# Patient Record
Sex: Male | Born: 1975 | Race: Black or African American | Hispanic: No | Marital: Single | Smoking: Current every day smoker
Health system: Southern US, Community
[De-identification: ages and names within clinical notes are randomized; demographics above are authoritative.]

## PROBLEM LIST (undated history)

## (undated) ENCOUNTER — Ambulatory Visit: Discharge status: Absent without leave | Payer: Self-pay

## (undated) ENCOUNTER — Ambulatory Visit: Payer: Self-pay | Source: Home / Self Care

---

## 1999-10-25 ENCOUNTER — Emergency Department (HOSPITAL_COMMUNITY): Admission: EM | Admit: 1999-10-25 | Discharge: 1999-10-25 | Payer: Self-pay | Admitting: Emergency Medicine

## 2001-06-08 ENCOUNTER — Emergency Department (HOSPITAL_COMMUNITY): Admission: EM | Admit: 2001-06-08 | Discharge: 2001-06-09 | Payer: Self-pay | Admitting: Emergency Medicine

## 2001-06-09 ENCOUNTER — Encounter: Payer: Self-pay | Admitting: Emergency Medicine

## 2017-04-02 ENCOUNTER — Emergency Department (HOSPITAL_COMMUNITY)
Admission: EM | Admit: 2017-04-02 | Discharge: 2017-04-02 | Disposition: A | Discharge status: Home / Self Care | Payer: Self-pay | Attending: Emergency Medicine | Admitting: Emergency Medicine

## 2017-04-02 ENCOUNTER — Encounter (HOSPITAL_COMMUNITY): Payer: Self-pay | Admitting: *Deleted

## 2017-04-02 ENCOUNTER — Other Ambulatory Visit: Payer: Self-pay

## 2017-04-02 DIAGNOSIS — H00015 Hordeolum externum left lower eyelid: Secondary | ICD-10-CM | POA: Insufficient documentation

## 2017-04-02 DIAGNOSIS — H5712 Ocular pain, left eye: Secondary | ICD-10-CM | POA: Insufficient documentation

## 2017-04-02 DIAGNOSIS — F17210 Nicotine dependence, cigarettes, uncomplicated: Secondary | ICD-10-CM | POA: Insufficient documentation

## 2017-04-02 NOTE — ED Provider Notes (Signed)
[facility] EMERGENCY DEPARTMENT Provider Note   CSN: [id number] Arrival date & time: 04/02/17  0406     History   Chief Complaint Chief Complaint  Patient presents with  . Eye Problem    HPI Ronald Valencia is a 41 y.o. male presents to ED for evaluation of localized, intermittent, achy pain and swelling to left lower eyelid x 2 days. Denies trauma, warmth, drainage, blurred vision, double vision, fevers, pain with eye movements. No interventions PTA. No sick contacts. Aggravated by direct palpation.   HPI  History reviewed. No pertinent past medical history.  There are no active problems to display for this patient.   History reviewed. No pertinent surgical history.     Home Medications    Prior to Admission medications   Not on File    Family History No family history on file.  Social History Social History   Tobacco Use  . Smoking status: Current Every Day Smoker  . Smokeless tobacco: Never Used  Substance Use Topics  . Alcohol use: Yes  . Drug use: No     Allergies   Patient has no known allergies.   Review of Systems Review of Systems  Eyes: Positive for pain.       +eye lid swelling   All other systems reviewed and are negative.    Physical Exam Updated Vital Signs BP (!) 142/91 (BP Location: Left Arm)   Pulse 92   Temp 98.2 F (36.8 C) (Oral)   Resp 18   Ht 6\' 1"  (1.854 m)   Wt 88.5 kg (195 lb)   SpO2 98%   BMI 25.73 kg/m   Physical Exam  Constitutional: He is oriented to person, place, and time. He appears well-developed and well-nourished. No distress.  NAD.  HENT:  Head: Normocephalic and atraumatic.  Right Ear: External ear normal.  Left Ear: External ear normal.  Nose: Nose normal.  Mildly tender, localize bump to medial aspect of left lower eyelid with localized edema only. Mildly inject lower eyelid conjunctiva. Sclera normal. No drainage. PERRL and EOMs intact bilaterally. Surrounding lower eyelid and  upper eyelid normal w/o cellulitis, edema, warmth. No pain with eye movements.    Eyes: Conjunctivae and EOM are normal. No scleral icterus.  Neck: Normal range of motion.  Cardiovascular: Normal rate.  Pulmonary/Chest: Effort normal.  Musculoskeletal: Normal range of motion. He exhibits no deformity.  Neurological: He is alert and oriented to person, place, and time.  Skin: Skin is warm and dry. Capillary refill takes less than 2 seconds.  Psychiatric: He has a normal mood and affect. His behavior is normal. Judgment and thought content normal.  Nursing note and vitals reviewed.    ED Treatments / Results  Labs (all labs ordered are listed, but only abnormal results are displayed) Labs Reviewed - No data to display  EKG  EKG Interpretation None       Radiology No results found.  Procedures Procedures (including critical care time)  Medications Ordered in ED Medications - No data to display   Initial Impression / Assessment and Plan / ED Course  I have reviewed the triage vital signs and the nursing notes.  Pertinent labs & imaging results that were available during my care of the patient were reviewed by me and considered in my medical decision making (see chart for details).     History and exam consistend with uncomplicated hordeolum. No evidence of peri/orbital cellulitis or conjunctival/scleral involvement. No pain with EOMs. No  fevers. Will tx conservatively with warm compresses and ophtho f/u if does not resolve within 1-2 weeks. Discussed s/s that would warrant return to ED for peri/orbital cellulitis, abscess, conjunctivitis. Pt and wife at bedside agreeable with ED tx and d/c plan.   Final Clinical Impressions(s) / ED Diagnoses   Final diagnoses:  Hordeolum externum of left lower eyelid    ED Discharge Orders    None       Liberty HandyGibbons, Claudia J, PA-C 04/02/17 [id number]    Zadie RhineWickline, Donald, MD 04/02/17 [phone]

## 2017-04-02 NOTE — Discharge Instructions (Signed)
A hordeolum (Stye) is an abscess or collection of infection in eyelid. It can cause swelling, pain, redness. These arise from glands in the eyelash follicle. Most styes resolve spontaneously over several days and do not require specific intervention.   Apply warm compresses for about 15 min four times per day in order to facilitate drainage. Massage and gently push upward on the eyelid to aid in drainage. Follow up with an eye doctor if the size does not improve in one to two weeks. An eye doctor can sometimes help with drainage.   Return to ED if there is extensive redness, warmth, pain to the skin around the eye/cheek.

## 2017-04-02 NOTE — ED Triage Notes (Signed)
Pt c/o L eye swelling x 2 days, denies allergies or blurred vision.

## 2017-09-04 ENCOUNTER — Ambulatory Visit (HOSPITAL_COMMUNITY)
Admission: EM | Admit: 2017-09-04 | Discharge: 2017-09-04 | Disposition: A | Discharge status: Home / Self Care | Payer: Self-pay | Attending: Family Medicine | Admitting: Family Medicine

## 2017-09-04 ENCOUNTER — Other Ambulatory Visit: Payer: Self-pay

## 2017-09-04 ENCOUNTER — Encounter (HOSPITAL_COMMUNITY): Payer: Self-pay | Admitting: Emergency Medicine

## 2017-09-04 DIAGNOSIS — Z113 Encounter for screening for infections with a predominantly sexual mode of transmission: Secondary | ICD-10-CM

## 2017-09-04 DIAGNOSIS — Z202 Contact with and (suspected) exposure to infections with a predominantly sexual mode of transmission: Secondary | ICD-10-CM | POA: Insufficient documentation

## 2017-09-04 DIAGNOSIS — F17210 Nicotine dependence, cigarettes, uncomplicated: Secondary | ICD-10-CM | POA: Insufficient documentation

## 2017-09-04 MED ORDER — AZITHROMYCIN 250 MG PO TABS
1000.0000 mg | ORAL_TABLET | Freq: Once | ORAL | Status: AC
  Administered 2017-09-04: 1000 mg via ORAL

## 2017-09-04 MED ORDER — AZITHROMYCIN 250 MG PO TABS
ORAL_TABLET | ORAL | Status: AC
  Filled 2017-09-04: qty 4

## 2017-09-04 MED ORDER — CEFTRIAXONE SODIUM 250 MG IJ SOLR
250.0000 mg | Freq: Once | INTRAMUSCULAR | Status: AC
  Administered 2017-09-04: 250 mg via INTRAMUSCULAR

## 2017-09-04 MED ORDER — CEFTRIAXONE SODIUM 250 MG IJ SOLR
INTRAMUSCULAR | Status: AC
  Filled 2017-09-04: qty 250

## 2017-09-04 NOTE — ED Provider Notes (Signed)
[facility]   [id number] 09/04/17 Arrival Time: 1009   SUBJECTIVE:  Holden Maniscalco is a 42 y.o. male who presents requesting STI screening.  Last unprotected sexual encounter was 2 days ago.  Sexually active with 2 male partners.  Patient reports exposure to partner with BV and trichomonas.  Currently asymptomatic.  Requests treatment.  Denies fever, chills, nausea, vomiting, abdominal or penile discharge, penile rashes or lesions, or urinary symptoms.    No LMP for male patient.  ROS: As per HPI.  History reviewed. No pertinent past medical history. History reviewed. No pertinent surgical history. No Known Allergies No current facility-administered medications on file prior to encounter.    No current outpatient medications on file prior to encounter.    Social History   Socioeconomic History  . Marital status: Married    Spouse name: Not on file  . Number of children: Not on file  . Years of education: Not on file  . Highest education level: Not on file  Occupational History  . Not on file  Social Needs  . Financial resource strain: Not on file  . Food insecurity:    Worry: Not on file    Inability: Not on file  . Transportation needs:    Medical: Not on file    Non-medical: Not on file  Tobacco Use  . Smoking status: Current Every Day Smoker    Packs/day: 0.25    Types: Cigarettes  . Smokeless tobacco: Never Used  Substance and Sexual Activity  . Alcohol use: Yes  . Drug use: No  . Sexual activity: Yes    Birth control/protection: None  Lifestyle  . Physical activity:    Days per week: Not on file    Minutes per session: Not on file  . Stress: Not on file  Relationships  . Social connections:    Talks on phone: Not on file    Gets together: Not on file    Attends religious service: Not on file    Active member of club or organization: Not on file    Attends meetings of clubs or organizations: Not on file    Relationship status: Not on file  .  Intimate partner violence:    Fear of current or ex partner: Not on file    Emotionally abused: Not on file    Physically abused: Not on file    Forced sexual activity: Not on file  Other Topics Concern  . Not on file  Social History Narrative  . Not on file   Family History  Family history unknown: Yes    OBJECTIVE:  Vitals:   09/04/17 1034  BP: (!) 142/87  Pulse: 81  Temp: 97.9 F (36.6 C)  TempSrc: Oral  SpO2: 100%     General appearance: alert, cooperative, appears stated age and no distress Throat: lips, mucosa, and tongue normal; teeth and gums normal Lungs: CTA bilaterally without adventitious breath sounds Heart: regular rate and rhythm.  Radial pulses 2+ symmetrical bilaterally Abdomen: soft, non-tender; bowel sounds normal; nontender to palpation; no guarding Skin: warm and dry Psychological:  Alert and cooperative. Normal mood and affect.  No results found for this or any previous visit.  Labs Reviewed  HIV ANTIBODY (ROUTINE TESTING)  [facility]  URINE CYTOLOGY ANCILLARY ONLY    ASSESSMENT & PLAN:  1. STD exposure     Meds ordered this encounter  Medications  . azithromycin (ZITHROMAX) tablet 1,000 mg  . cefTRIAXone (ROCEPHIN) injection 250 mg  Pending: Labs Reviewed  HIV ANTIBODY (ROUTINE TESTING)  [facility]  URINE CYTOLOGY ANCILLARY ONLY    Given rocephin  injection and azithromycin 1g in office Urine cytology obtained HIV/ syphilis ordered We will follow up with you regarding the results of your tests If tests are positive, please abstain from sexual activity for at least 7 days and notify partners Follow up with PCP if symptoms persists Return here or go to ER if you have any new or worsening symptoms    Reviewed expectations re: course of current medical issues. Questions answered. Outlined signs and symptoms indicating need for more acute intervention. Patient verbalized understanding. After Visit Summary given.       Rennis Harding, PA-C 09/04/17 1102

## 2017-09-04 NOTE — ED Triage Notes (Signed)
Pt reports being exposed to BV and Trichomonas.  Pt denies any symptoms.

## 2017-09-04 NOTE — Discharge Instructions (Addendum)
Given rocephin  injection and azithromycin 1g in office Urine cytology obtained HIV/ syphilis ordered We will follow up with you regarding the results of your tests If tests are positive, please abstain from sexual activity for at least 7 days and notify partners Follow up with PCP if symptoms persists Return here or go to ER if you have any new or worsening symptoms

## 2017-09-05 LAB — HIV ANTIBODY (ROUTINE TESTING W REFLEX): HIV Screen 4th Generation wRfx: NONREACTIVE

## 2017-09-05 LAB — RPR: RPR Ser Ql: NONREACTIVE

## 2017-09-05 LAB — URINE CYTOLOGY ANCILLARY ONLY
Chlamydia: NEGATIVE
Neisseria Gonorrhea: NEGATIVE
Trichomonas: NEGATIVE

## 2017-09-06 LAB — URINE CYTOLOGY ANCILLARY ONLY
BACTERIAL VAGINITIS: POSITIVE — AB
Candida vaginitis: NEGATIVE

## 2017-09-10 ENCOUNTER — Telehealth (HOSPITAL_COMMUNITY): Payer: Self-pay

## 2017-09-10 MED ORDER — METRONIDAZOLE 500 MG PO TABS: 500.0000 mg | ORAL_TABLET | Freq: Two times a day (BID) | ORAL | 0 refills | Status: DC

## 2017-09-10 NOTE — Telephone Encounter (Signed)
Bacterial vaginosis is positive. This was not treated at the urgent care visit.  Flagyl 500 mg BID x 7 days #14 no refills sent to patients pharmacy of choice per [place]. Attempted to reach patient. No answer. Message sent to MyChart, no voicemail set up.

## 2018-06-09 ENCOUNTER — Encounter (HOSPITAL_COMMUNITY): Payer: Self-pay

## 2018-06-09 ENCOUNTER — Emergency Department (HOSPITAL_COMMUNITY)
Admission: EM | Admit: 2018-06-09 | Discharge: 2018-06-10 | Disposition: A | Discharge status: Home / Self Care | Payer: Self-pay | Attending: Emergency Medicine | Admitting: Emergency Medicine

## 2018-06-09 ENCOUNTER — Emergency Department (HOSPITAL_COMMUNITY): Payer: Self-pay

## 2018-06-09 DIAGNOSIS — Y929 Unspecified place or not applicable: Secondary | ICD-10-CM | POA: Insufficient documentation

## 2018-06-09 DIAGNOSIS — S20212A Contusion of left front wall of thorax, initial encounter: Secondary | ICD-10-CM | POA: Insufficient documentation

## 2018-06-09 DIAGNOSIS — Z79899 Other long term (current) drug therapy: Secondary | ICD-10-CM | POA: Insufficient documentation

## 2018-06-09 DIAGNOSIS — W2209XA Striking against other stationary object, initial encounter: Secondary | ICD-10-CM | POA: Insufficient documentation

## 2018-06-09 DIAGNOSIS — Y939 Activity, unspecified: Secondary | ICD-10-CM | POA: Insufficient documentation

## 2018-06-09 DIAGNOSIS — Y999 Unspecified external cause status: Secondary | ICD-10-CM | POA: Insufficient documentation

## 2018-06-09 DIAGNOSIS — R10819 Abdominal tenderness, unspecified site: Secondary | ICD-10-CM | POA: Insufficient documentation

## 2018-06-09 DIAGNOSIS — F17210 Nicotine dependence, cigarettes, uncomplicated: Secondary | ICD-10-CM | POA: Insufficient documentation

## 2018-06-09 NOTE — ED Triage Notes (Signed)
Pt states that he was at work today and had a mechanical fall onto his L ribs, now having pain, worse with movement and taking a deep breath.

## 2018-06-10 ENCOUNTER — Emergency Department (HOSPITAL_COMMUNITY): Payer: Self-pay

## 2018-06-10 LAB — CBC WITH DIFFERENTIAL/PLATELET
Abs Immature Granulocytes: 0.02 10*3/uL (ref 0.00–0.07)
Basophils Absolute: 0 10*3/uL (ref 0.0–0.1)
Basophils Relative: 0 %
Eosinophils Absolute: 0.1 10*3/uL (ref 0.0–0.5)
Eosinophils Relative: 1 %
HCT: 45.9 % (ref 39.0–52.0)
Hemoglobin: 14.6 g/dL (ref 13.0–17.0)
Immature Granulocytes: 0 %
Lymphocytes Relative: 37 %
Lymphs Abs: 3.1 10*3/uL (ref 0.7–4.0)
MCH: 29.2 pg (ref 26.0–34.0)
MCHC: 31.8 g/dL (ref 30.0–36.0)
MCV: 91.8 fL (ref 80.0–100.0)
Monocytes Absolute: 0.7 10*3/uL (ref 0.1–1.0)
Monocytes Relative: 8 %
Neutro Abs: 4.5 10*3/uL (ref 1.7–7.7)
Neutrophils Relative %: 54 %
Platelets: 272 10*3/uL (ref 150–400)
RBC: 5 MIL/uL (ref 4.22–5.81)
RDW: 12.9 % (ref 11.5–15.5)
WBC: 8.4 10*3/uL (ref 4.0–10.5)
nRBC: 0 % (ref 0.0–0.2)

## 2018-06-10 LAB — BASIC METABOLIC PANEL
Anion gap: 10 (ref 5–15)
BUN: 13 mg/dL (ref 6–20)
CO2: 23 mmol/L (ref 22–32)
Calcium: 9.3 mg/dL (ref 8.9–10.3)
Chloride: 104 mmol/L (ref 98–111)
Creatinine, Ser: 0.99 mg/dL (ref 0.61–1.24)
GFR calc Af Amer: >60 mL/min (ref >60)
GFR calc non Af Amer: >60 mL/min (ref >60)
Glucose, Bld: 103 mg/dL — ABNORMAL HIGH (ref 70–99)
Potassium: 3.6 mmol/L (ref 3.5–5.1)
Sodium: 137 mmol/L (ref 135–145)

## 2018-06-10 MED ORDER — MORPHINE SULFATE (PF) 4 MG/ML IV SOLN
4.0000 mg | Freq: Once | INTRAVENOUS | Status: AC
  Administered 2018-06-10: 4 mg via INTRAVENOUS
  Filled 2018-06-10: qty 1

## 2018-06-10 MED ORDER — HYDROCODONE-ACETAMINOPHEN 5-325 MG PO TABS: 1.0000 | ORAL_TABLET | Freq: Four times a day (QID) | ORAL | 0 refills | Status: DC | PRN

## 2018-06-10 MED ORDER — IBUPROFEN 600 MG PO TABS: 600.0000 mg | ORAL_TABLET | Freq: Four times a day (QID) | ORAL | 0 refills | Status: DC | PRN

## 2018-06-10 MED ORDER — METHOCARBAMOL 750 MG PO TABS: 750.0000 mg | ORAL_TABLET | Freq: Two times a day (BID) | ORAL | 0 refills | Status: DC

## 2018-06-10 MED ORDER — IOHEXOL 300 MG/ML  SOLN
100.0000 mL | Freq: Once | INTRAMUSCULAR | Status: AC | PRN
  Administered 2018-06-10: 100 mL via INTRAVENOUS

## 2018-06-10 NOTE — ED Provider Notes (Signed)
[facility] EMERGENCY DEPARTMENT Provider Note   CSN: [id number] Arrival date & time: 06/09/18  2331    History   Chief Complaint Chief Complaint  Patient presents with  . Rib pain    HPI Ronald Valencia is a 43 y.o. male who is previously healthy who presents with left rib and left flank pain after falling on a guard rail while carrying tools.  He reports he has had significant pain to the area since.  Is worse with talking, breathing, moving.  He denies any chest pain, nausea, vomiting.  He did not take any medication prior to arrival.     HPI  History reviewed. No pertinent past medical history.  There are no active problems to display for this patient.   History reviewed. No pertinent surgical history.      Home Medications    Prior to Admission medications   Medication Sig Start Date End Date Taking? Authorizing Provider  HYDROcodone-acetaminophen (NORCO/VICODIN) 5-325 MG tablet Take 1 tablet by mouth every 6 (six) hours as needed for severe pain. 06/10/18   Jaquelyne Firkus, Waylan Boga, PA-C  ibuprofen (ADVIL,MOTRIN) 600 MG tablet Take 1 tablet (600 mg total) by mouth every 6 (six) hours as needed. 06/10/18   Akil Hoos, Waylan Boga, PA-C  methocarbamol (ROBAXIN) 750 MG tablet Take 1 tablet (750 mg total) by mouth 2 (two) times daily. 06/10/18   Hazley Dezeeuw, Waylan Boga, PA-C  metroNIDAZOLE (FLAGYL) 500 MG tablet Take 1 tablet (500 mg total) by mouth 2 (two) times daily. 09/10/17   Rennis Harding, PA-C    Family History Family History  Family history unknown: Yes    Social History Social History   Tobacco Use  . Smoking status: Current Every Day Smoker    Packs/day: 0.25    Types: Cigarettes  . Smokeless tobacco: Never Used  Substance Use Topics  . Alcohol use: Yes  . Drug use: No     Allergies   Patient has no known allergies.   Review of Systems Review of Systems  Constitutional: Negative for chills and fever.  HENT: Negative for facial swelling and sore throat.    Respiratory: Positive for shortness of breath (pain with deep breaths due to rib pain).   Cardiovascular: Negative for chest pain.  Gastrointestinal: Negative for abdominal pain, nausea and vomiting.  Genitourinary: Negative for dysuria.  Musculoskeletal: Negative for back pain.  Skin: Negative for rash and wound.  Neurological: Negative for headaches.  Psychiatric/Behavioral: The patient is not nervous/anxious.      Physical Exam Updated Vital Signs BP (!) 142/99 (BP Location: Left Arm)   Pulse 84   Temp 98.1 F (36.7 C) (Oral)   Resp 16   SpO2 100%   Physical Exam Vitals signs and nursing note reviewed.  Constitutional:      General: He is not in acute distress.    Appearance: He is well-developed. He is not diaphoretic.  HENT:     Head: Normocephalic and atraumatic.     Mouth/Throat:     Pharynx: No oropharyngeal exudate.  Eyes:     General: No scleral icterus.       Right eye: No discharge.        Left eye: No discharge.     Conjunctiva/sclera: Conjunctivae normal.     Pupils: Pupils are equal, round, and reactive to light.  Neck:     Musculoskeletal: Normal range of motion and neck supple.     Thyroid: No thyromegaly.  Cardiovascular:  Rate and Rhythm: Normal rate and regular rhythm.     Heart sounds: Normal heart sounds. No murmur. No friction rub. No gallop.   Pulmonary:     Effort: Pulmonary effort is normal. No respiratory distress.     Breath sounds: Normal breath sounds. No stridor. No wheezing or rales.  Abdominal:     General: Bowel sounds are normal. There is no distension.     Palpations: Abdomen is soft.     Tenderness: There is abdominal tenderness. There is no guarding or rebound.       Comments: Significant tenderness to the lower ribs, but also significant in the left lateral abdomen not with underlying ribs, no ecchymosis noted  Lymphadenopathy:     Cervical: No cervical adenopathy.  Skin:    General: Skin is warm and dry.      Coloration: Skin is not pale.     Findings: No rash.  Neurological:     Mental Status: He is alert.     Coordination: Coordination normal.      ED Treatments / Results  Labs (all labs ordered are listed, but only abnormal results are displayed) Labs Reviewed  BASIC METABOLIC PANEL - Abnormal; Notable for the following components:      Result Value   Glucose, Bld 103 (*)    All other components within normal limits  CBC WITH DIFFERENTIAL/PLATELET    EKG None  Radiology Dg Ribs Unilateral W/chest Left  Result Date: 06/10/2018 CLINICAL DATA:  Mechanical fall with pain EXAM: LEFT RIBS AND CHEST - 3+ VIEW COMPARISON:  Report 06/09/2001 FINDINGS: No fracture or other bone lesions are seen involving the ribs. There is no evidence of pneumothorax or pleural effusion. Both lungs are clear. Heart size and mediastinal contours are within normal limits. IMPRESSION: Negative. Electronically Signed   By: Jasmine Pang M.D.   On: 06/10/2018 00:24   Ct Abdomen Pelvis W Contrast  Result Date: 06/10/2018 CLINICAL DATA:  Left abdominal and rib tenderness after blunt trauma earlier today, fall onto left side. EXAM: CT ABDOMEN AND PELVIS WITH CONTRAST TECHNIQUE: Multidetector CT imaging of the abdomen and pelvis was performed using the standard protocol following bolus administration of intravenous contrast. CONTRAST:  OMNIPAQUE IOHEXOL 300 MG/ML  SOLN COMPARISON:  Left rib radiographs earlier this day. FINDINGS: Lower chest: No basilar pneumothorax. No fracture of included lower ribs. No pleural fluid. Hepatobiliary: No hepatic injury or perihepatic hematoma. Gallbladder is unremarkable. Pancreas: No evidence of injury. No ductal dilatation or inflammation. Spleen: No splenic injury or perisplenic hematoma. Adrenals/Urinary Tract: No adrenal hemorrhage or renal injury identified. Homogeneous renal enhancement. No hydronephrosis. Bladder is unremarkable. Stomach/Bowel: No evidence of bowel or mesenteric  injury. No bowel wall thickening or inflammatory change. Normal appendix. Stomach is unremarkable. Vascular/Lymphatic: No vascular injury. Abdominal aorta and IVC are intact. No retroperitoneal fluid. Minor aortic atherosclerosis. No adenopathy. Reproductive: Prostate is unremarkable. Other: No confluent body wall contusion. No free fluid or free air. Musculoskeletal: No fracture of the included ribs, thoracolumbar spine, or bony pelvis. IMPRESSION: No evidence of acute traumatic injury to the abdomen or pelvis. Electronically Signed   By: Narda Rutherford M.D.   On: 06/10/2018 02:13    Procedures Procedures (including critical care time)  Medications Ordered in ED Medications  morphine 4 MG/ML injection 4 mg (4 mg Intravenous Given 06/10/18 0111)  iohexol (OMNIPAQUE) 300 MG/ML solution 100 mL (100 mLs Intravenous Contrast Given 06/10/18 0139)     Initial Impression / Assessment and Plan /  ED Course  I have reviewed the triage vital signs and the nursing notes.  Pertinent labs & imaging results that were available during my care of the patient were reviewed by me and considered in my medical decision making (see chart for details).        Patient presenting with rib and abdominal contusions.  Labs are unremarkable.  Initial rib films negative.  However, considering patient significant tenderness to the left flank and abdomen, CT abdomen pelvis was conducted which showed no internal hemorrhage or rib fractures.  Patient given incentive spirometer to take home.  Will discharge home with ibuprofen, Vicodin, Robaxin.  I reviewed the [place] narcotic database and found no discrepancies.  Ice discussed.  Patient counseled on normal duration of symptoms with rib contusions.  Return precautions discussed.  Patient understands and agrees with plan.  Patient vital stable throughout ED course and discharged in satisfactory condition.  Final Clinical Impressions(s) / ED Diagnoses   Final diagnoses:  Contusion  of rib on left side, initial encounter    ED Discharge Orders         Ordered    ibuprofen (ADVIL,MOTRIN) 600 MG tablet  Every 6 hours PRN     06/10/18 0227    HYDROcodone-acetaminophen (NORCO/VICODIN) 5-325 MG tablet  Every 6 hours PRN     06/10/18 0227    methocarbamol (ROBAXIN) 750 MG tablet  2 times daily     06/10/18 0227           Emi Holes, PA-C 06/10/18 0258    Melene Plan, DO 06/10/18 [phone]

## 2018-06-10 NOTE — Discharge Instructions (Addendum)
Rib contusions can take several weeks to get better.Use ice 3-4 times daily alternating 20 minutes on, 20 minutes off.  Take ibuprofen every 6 hours as needed for your pain.  For breakthrough pain, you can take 1 Vicodin every 6 hours.  Take Robaxin twice daily as needed for muscle pain or spasms.  Do not drive or operate machinery while taking Robaxin or Vicodin.  Use incentive spirometer several times daily to help encourage deep breaths and prevent pneumonia.  Please return to the emergency department if you develop any new or worsening symptoms.   Do not drink alcohol, drive, operate machinery or participate in any other potentially dangerous activities while taking opiate pain medication as it may make you sleepy. Do not take this medication with any other sedating medications, either prescription or over-the-counter. If you were prescribed Percocet or Vicodin, do not take these with acetaminophen (Tylenol) as it is already contained within these medications and overdose of Tylenol is dangerous.   This medication is an opiate (or narcotic) pain medication and can be habit forming.  Use it as little as possible to achieve adequate pain control.  Do not use or use it with extreme caution if you have a history of opiate abuse or dependence. This medication is intended for your use only - do not give any to anyone else and keep it in a secure place where nobody else, especially children, have access to it. It will also cause or worsen constipation, so you may want to consider taking an over-the-counter stool softener while you are taking this medication.

## 2018-08-13 ENCOUNTER — Other Ambulatory Visit: Payer: Self-pay

## 2018-08-13 ENCOUNTER — Ambulatory Visit (HOSPITAL_COMMUNITY)
Admission: EM | Admit: 2018-08-13 | Discharge: 2018-08-13 | Disposition: A | Discharge status: Home / Self Care | Payer: Self-pay | Attending: Family Medicine | Admitting: Family Medicine

## 2018-08-13 ENCOUNTER — Encounter (HOSPITAL_COMMUNITY): Payer: Self-pay | Admitting: Emergency Medicine

## 2018-08-13 DIAGNOSIS — Z113 Encounter for screening for infections with a predominantly sexual mode of transmission: Secondary | ICD-10-CM

## 2018-08-13 DIAGNOSIS — Z202 Contact with and (suspected) exposure to infections with a predominantly sexual mode of transmission: Secondary | ICD-10-CM | POA: Insufficient documentation

## 2018-08-13 MED ORDER — CEFTRIAXONE SODIUM 250 MG IJ SOLR
INTRAMUSCULAR | Status: AC
  Filled 2018-08-13: qty 250

## 2018-08-13 MED ORDER — AZITHROMYCIN 250 MG PO TABS
1000.0000 mg | ORAL_TABLET | Freq: Once | ORAL | Status: AC
  Administered 2018-08-13: 10:00:00 1000 mg via ORAL

## 2018-08-13 MED ORDER — AZITHROMYCIN 250 MG PO TABS
ORAL_TABLET | ORAL | Status: AC
  Filled 2018-08-13: qty 4

## 2018-08-13 MED ORDER — CEFTRIAXONE SODIUM 250 MG IJ SOLR
250.0000 mg | Freq: Once | INTRAMUSCULAR | Status: AC
  Administered 2018-08-13: 10:00:00 250 mg via INTRAMUSCULAR

## 2018-08-13 NOTE — ED Provider Notes (Signed)
[facility]    CSN: [id number] Arrival date & time: 08/13/18  [id number]     History   Chief Complaint Chief Complaint  Patient presents with  . SEXUALLY TRANSMITTED DISEASE    HPI Ronald Valencia is a 43 y.o. male.   HPI 43 year old gentleman who is here today to be evaluated for possible sexually transmitted disease.  He states he is in a monogamous relationship.  He states that his girlfriend want a baby, they have not used any sexual protection for a year.  He states he received a phone call from his girlfriend that she had tested positive for a "bacterial infection".  She blames him and insisted that he be checked.  He states he has no symptoms, no rash, no dysuria, no penile discharge, no fever.  History reviewed. No pertinent past medical history.  There are no active problems to display for this patient.   History reviewed. No pertinent surgical history.     Home Medications    Prior to Admission medications   Not on File    Family History Family History  Family history unknown: Yes    Social History Social History   Tobacco Use  . Smoking status: Current Every Day Smoker    Packs/day: 0.25    Types: Cigarettes  . Smokeless tobacco: Never Used  Substance Use Topics  . Alcohol use: Yes  . Drug use: No     Allergies   Patient has no known allergies.   Review of Systems Review of Systems  Constitutional: Negative for chills and fever.  HENT: Negative for ear pain and sore throat.   Eyes: Negative for pain and visual disturbance.  Respiratory: Negative for cough and shortness of breath.   Cardiovascular: Negative for chest pain and palpitations.  Gastrointestinal: Negative for abdominal pain and vomiting.  Genitourinary: Negative for discharge, dysuria and hematuria.  Musculoskeletal: Negative for arthralgias and back pain.  Skin: Negative for color change and rash.  Neurological: Negative for seizures and syncope.  All other systems  reviewed and are negative.    Physical Exam Triage Vital Signs ED Triage Vitals  Enc Vitals Group     BP 08/13/18 0841 (!) 154/94     Pulse Rate 08/13/18 0841 87     Resp 08/13/18 0841 16     Temp 08/13/18 0841 97.9 F (36.6 C)     Temp Source 08/13/18 0841 Oral     SpO2 08/13/18 0841 99 %   No data found.  Updated Vital Signs BP (!) 154/94 (BP Location: Left Arm)   Pulse 87   Temp 97.9 F (36.6 C) (Oral)   Resp 16   SpO2 99%      Physical Exam Constitutional:      General: He is not in acute distress.    Appearance: He is well-developed.  HENT:     Head: Normocephalic and atraumatic.  Eyes:     Conjunctiva/sclera: Conjunctivae normal.     Pupils: Pupils are equal, round, and reactive to light.  Neck:     Musculoskeletal: Normal range of motion.  Cardiovascular:     Rate and Rhythm: Normal rate.  Pulmonary:     Effort: Pulmonary effort is normal. No respiratory distress.  Abdominal:     General: There is no distension.     Palpations: Abdomen is soft.  Genitourinary:    Comments: GU exam deferred Musculoskeletal: Normal range of motion.  Skin:    General: Skin is warm and dry.  Neurological:     Mental Status: He is alert.      UC Treatments / Results  Labs (all labs ordered are listed, but only abnormal results are displayed) Labs Reviewed  RPR  HIV ANTIBODY (ROUTINE TESTING W REFLEX)  URINE CYTOLOGY ANCILLARY ONLY    EKG None  Radiology No results found.  Procedures Procedures (including critical care time)  Medications Ordered in UC Medications  cefTRIAXone (ROCEPHIN) injection 250 mg (has no administration in time range)  azithromycin (ZITHROMAX) tablet 1,000 mg (has no administration in time range)    Initial Impression / Assessment and Plan / UC Course  I have reviewed the triage vital signs and the nursing notes.  Pertinent labs & imaging results that were available during my care of the patient were reviewed by me and  considered in my medical decision making (see chart for details).     Reviewed importance of safe sex.  Discussed potential STD exposures.  Discussed BV. Final Clinical Impressions(s) / UC Diagnoses   Final diagnoses:  Possible exposure to STD     Discharge Instructions     Because you have possibly been exposed to a sexually transmitted disease you were being tested for chlamydia, gonorrhea, trichomonas, syphilis and HIV. You have been treated with antibiotics to cover chlamydia and gonorrhea We did lab testing during this visit.  If there are any abnormal findings or positive cultures , you will be notified.  If all of your tests are normal, you will not be called.   If you need your culture results, they will be available in 3 to 5 days.  You may call the office or check My Chart    ED Prescriptions    None     Controlled Substance Prescriptions [place] Controlled Substance Registry consulted? Not Applicable   Eustace Moore, MD 08/13/18 [phone]

## 2018-08-13 NOTE — ED Triage Notes (Signed)
Patient has been told by a partner that he was exposed to std.  Patient is denying pain or penile discharge

## 2018-08-13 NOTE — Discharge Instructions (Addendum)
Because you have possibly been exposed to a sexually transmitted disease you were being tested for chlamydia, gonorrhea, trichomonas, syphilis and HIV. You have been treated with antibiotics to cover chlamydia and gonorrhea We did lab testing during this visit.  If there are any abnormal findings or positive cultures , you will be notified.  If all of your tests are normal, you will not be called.   If you need your culture results, they will be available in 3 to 5 days.  You may call the office or check My Chart

## 2018-08-14 LAB — URINE CYTOLOGY ANCILLARY ONLY
Chlamydia: NEGATIVE
Neisseria Gonorrhea: NEGATIVE
Trichomonas: NEGATIVE

## 2018-08-14 LAB — RPR: RPR Ser Ql: NONREACTIVE

## 2018-08-14 LAB — HIV ANTIBODY (ROUTINE TESTING W REFLEX): HIV Screen 4th Generation wRfx: NONREACTIVE

## 2018-11-01 ENCOUNTER — Other Ambulatory Visit: Payer: Self-pay

## 2018-11-01 ENCOUNTER — Encounter (HOSPITAL_COMMUNITY): Payer: Self-pay | Admitting: Emergency Medicine

## 2018-11-01 ENCOUNTER — Emergency Department (HOSPITAL_COMMUNITY)
Admission: EM | Admit: 2018-11-01 | Discharge: 2018-11-01 | Disposition: A | Discharge status: Home / Self Care | Payer: Self-pay | Attending: Emergency Medicine | Admitting: Emergency Medicine

## 2018-11-01 ENCOUNTER — Emergency Department (HOSPITAL_COMMUNITY): Payer: Self-pay

## 2018-11-01 DIAGNOSIS — Y999 Unspecified external cause status: Secondary | ICD-10-CM | POA: Insufficient documentation

## 2018-11-01 DIAGNOSIS — M62838 Other muscle spasm: Secondary | ICD-10-CM

## 2018-11-01 DIAGNOSIS — F17210 Nicotine dependence, cigarettes, uncomplicated: Secondary | ICD-10-CM | POA: Insufficient documentation

## 2018-11-01 DIAGNOSIS — Y93H9 Activity, other involving exterior property and land maintenance, building and construction: Secondary | ICD-10-CM | POA: Insufficient documentation

## 2018-11-01 DIAGNOSIS — S29012A Strain of muscle and tendon of back wall of thorax, initial encounter: Secondary | ICD-10-CM | POA: Insufficient documentation

## 2018-11-01 DIAGNOSIS — M62830 Muscle spasm of back: Secondary | ICD-10-CM | POA: Insufficient documentation

## 2018-11-01 DIAGNOSIS — Y9269 Other specified industrial and construction area as the place of occurrence of the external cause: Secondary | ICD-10-CM | POA: Insufficient documentation

## 2018-11-01 DIAGNOSIS — X509XXA Other and unspecified overexertion or strenuous movements or postures, initial encounter: Secondary | ICD-10-CM | POA: Insufficient documentation

## 2018-11-01 LAB — BASIC METABOLIC PANEL
Anion gap: 8 (ref 5–15)
BUN: 13 mg/dL (ref 6–20)
CO2: 24 mmol/L (ref 22–32)
Calcium: 9 mg/dL (ref 8.9–10.3)
Chloride: 107 mmol/L (ref 98–111)
Creatinine, Ser: 1.02 mg/dL (ref 0.61–1.24)
GFR calc Af Amer: >60 mL/min (ref >60)
GFR calc non Af Amer: >60 mL/min (ref >60)
Glucose, Bld: 101 mg/dL — ABNORMAL HIGH (ref 70–99)
Potassium: 4.3 mmol/L (ref 3.5–5.1)
Sodium: 139 mmol/L (ref 135–145)

## 2018-11-01 LAB — CBC
HCT: 45.5 % (ref 39.0–52.0)
Hemoglobin: 15 g/dL (ref 13.0–17.0)
MCH: 30.5 pg (ref 26.0–34.0)
MCHC: 33 g/dL (ref 30.0–36.0)
MCV: 92.7 fL (ref 80.0–100.0)
Platelets: 269 10*3/uL (ref 150–400)
RBC: 4.91 MIL/uL (ref 4.22–5.81)
RDW: 12.6 % (ref 11.5–15.5)
WBC: 9.1 10*3/uL (ref 4.0–10.5)
nRBC: 0 % (ref 0.0–0.2)

## 2018-11-01 LAB — TROPONIN I (HIGH SENSITIVITY)
Troponin I (High Sensitivity): 3 ng/L (ref <18)
Troponin I (High Sensitivity): 3 ng/L

## 2018-11-01 MED ORDER — CYCLOBENZAPRINE HCL 10 MG PO TABS: 5.0000 mg | ORAL_TABLET | Freq: Every day | ORAL | 0 refills | Status: AC

## 2018-11-01 MED ORDER — KETOROLAC TROMETHAMINE 30 MG/ML IJ SOLN
30.0000 mg | Freq: Once | INTRAMUSCULAR | Status: AC
  Administered 2018-11-01: 07:00:00 30 mg via INTRAVENOUS
  Filled 2018-11-01: qty 1

## 2018-11-01 MED ORDER — SODIUM CHLORIDE 0.9% FLUSH: 3.0000 mL | Freq: Once | INTRAVENOUS | Status: DC

## 2018-11-01 NOTE — Discharge Instructions (Signed)
You may use over-the-counter Motrin (Ibuprofen), Acetaminophen (Tylenol), topical muscle creams such as SalonPas, Icy Hot, Bengay, etc. Please stretch, apply heat, and have massage therapy for additional assistance. ° °

## 2018-11-01 NOTE — ED Provider Notes (Signed)
[facility] Provider Note   CSN: [id number] Arrival date & time: 11/01/18  0015    History   Chief Complaint Chief Complaint  Patient presents with  . Chest Pain    HPI Ronald Valencia is a 43 y.o. male with no significant PMH who presents to the emergency room with a 2 day history of chest and back pain. Pt describes the pain as sharp and shooting and radiates all the way down his back and to the center of his chest. He states it is 10/10 at rest. The pain is worsened by initial movements and made minimally better after moving for awhile. Denies shortness of breath, nausea, or vomiting. Of note, pt works two labor intensive jobs.    Chest Pain Pain location:  Substernal area Pain quality: sharp and shooting   Pain radiates to:  Upper back and mid back Pain severity:  Severe Onset quality:  Gradual Duration:  2 days Timing:  Constant Progression:  Worsening Chronicity:  New Context: lifting   Relieved by: ibuprofen. Worsened by:  Exertion and movement Associated symptoms: back pain   Associated symptoms: no abdominal pain, no diaphoresis, no fever, no headache, no nausea, no palpitations, no shortness of breath, no vomiting and no weakness   Risk factors: hypertension and smoking    History reviewed. No pertinent past medical history.  There are no active problems to display for this patient.   History reviewed. No pertinent surgical history.    Home Medications    Prior to Admission medications   Medication Sig Start Date End Date Taking? Authorizing Provider  cyclobenzaprine (FLEXERIL) 10 MG tablet Take 0.5-1 tablets (5-10 mg total) by mouth at bedtime for 10 days. 11/01/18 11/11/18  Fatima Blank, MD    Family History Family History  Family history unknown: Yes    Social History Social History   Tobacco Use  . Smoking status: Current Every Day Smoker    Packs/day: 0.25    Types: Cigarettes  . Smokeless tobacco:  Never Used  Substance Use Topics  . Alcohol use: Yes  . Drug use: No     Allergies   Patient has no known allergies.   Review of Systems Review of Systems  Constitutional: Negative for chills, diaphoresis and fever.  Respiratory: Negative for chest tightness and shortness of breath.   Cardiovascular: Positive for chest pain. Negative for palpitations.  Gastrointestinal: Negative for abdominal pain, nausea and vomiting.  Musculoskeletal: Positive for back pain.  Neurological: Negative for weakness and headaches.    Physical Exam Updated Vital Signs BP (!) 134/91 (BP Location: Left Arm)   Pulse 68   Temp 98.7 F (37.1 C) (Oral)   Resp 17   Ht 6\' 1"  (1.854 m)   Wt 83.9 kg   SpO2 100%   BMI 24.41 kg/m   Physical Exam Vitals signs and nursing note reviewed.  Constitutional:      Appearance: He is well-developed.  HENT:     Head: Normocephalic and atraumatic.  Eyes:     Conjunctiva/sclera: Conjunctivae normal.  Neck:     Musculoskeletal: Normal range of motion. No muscular tenderness.  Cardiovascular:     Rate and Rhythm: Normal rate and regular rhythm.     Heart sounds: No murmur.  Pulmonary:     Effort: Pulmonary effort is normal. No respiratory distress.     Breath sounds: Normal breath sounds.  Abdominal:     Palpations: Abdomen is soft.  Tenderness: There is no abdominal tenderness.  Musculoskeletal:     Comments: Thoracic spine tenderness reproducible on palpation.  Skin:    General: Skin is warm and dry.  Neurological:     Mental Status: He is alert.     ED Treatments / Results  Labs (all labs ordered are listed, but only abnormal results are displayed) Labs Reviewed  BASIC METABOLIC PANEL - Abnormal; Notable for the following components:      Result Value   Glucose, Bld 101 (*)    All other components within normal limits  CBC  TROPONIN I (HIGH SENSITIVITY)  TROPONIN I (HIGH SENSITIVITY)    EKG EKG Interpretation  Date/Time:  Sunday  November 01 2018 00:25:06 EDT Ventricular Rate:  82 PR Interval:  170 QRS Duration: 86 QT Interval:  362 QTC Calculation: 422 R Axis:   92 Text Interpretation:  Normal sinus rhythm Right atrial enlargement Rightward axis Pulmonary disease pattern Abnormal ECG NO STEMI. No old tracing to compare Confirmed by Cardama, Pedro ([id number]) on 11/01/2018 6:12:29 AM   Radiology Dg Chest 2 View  Result Date: 11/01/2018 CLINICAL DATA:  43 year old male with chest pain. EXAM: CHEST - 2 VIEW COMPARISON:  Chest radiograph dated 06/09/2018 FINDINGS: The heart size and mediastinal contours are within normal limits. Both lungs are clear. The visualized skeletal structures are unremarkable. IMPRESSION: No active cardiopulmonary disease. Electronically Signed   By: Arash  Radparvar M.D.   On: 11/01/2018 01:13    Procedures Procedures (including critical care time)  Medications Ordered in ED Medications  sodium chloride flush (NS) 0.9 % injection 3 mL (has no administration in time range)  ketorolac (TORADOL) 30 MG/ML injection 30 mg (30 mg Intravenous Given 11/01/18 0701)    Initial Impression / Assessment and Plan / ED Course  I have reviewed the triage vital signs and the nursing notes.  Pertinent labs & imaging results that were available during my care of the patient were reviewed by me and considered in my medical decision making (see chart for details).   Ronald Valencia is a 43 y.o. male with no significant PMH who presents to the emergency room with a 2 day history of chest and back pain. ACS ruled out by troponin negative x2, EKG negative for ischemia, CXR negative for cardiopulmonary disease. Aortic dissection less likely with history of gradual onset and 2 day history of chest/back pain. PE less likely without tachycardia, shortness of breath, or hypoxia.  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 Back pain being reproducible on palpation also  make musculoskeletal most likely. Gave 30mg IM Toradol while in the emergency room. Pt is stable for discharge home this morning and counseled about continuing anti-inflammatories, heat, and stretching at home. Ordered for cyclobenzaprine to take for 10 days.     Final Clinical Impressions(s) / ED Diagnoses   Final diagnoses:  Muscle strain of upper back  Spasm of muscle    ED Discharge Orders         Ordered    cyclobenzaprine (FLEXERIL) 10 MG tablet  Daily at bedtime     07 /26/20 0707           Thom ChimesJones, Landrey Mahurin, MD 11/01/18 [id number]    Nira Connardama, Pedro Eduardo, MD 11/01/18 1948

## 2018-11-01 NOTE — ED Triage Notes (Signed)
Pt reports he has left sided chest/side/back pain since yesterday.  The pain worsens w/ movement.  Denies any other symptoms at this time.  No abnormal SOB, (pt has a very physical job).

## 2020-02-13 IMAGING — CT CT ABD-PELV W/ CM
2 of 5 series · 16 of 46 positions shown, 18 images · IV contrast (Omni 300)
Comparison: Left rib radiographs earlier this day.

CLINICAL DATA: Left abdominal and rib tenderness after blunt trauma
earlier today, fall onto left side.

EXAM:
CT ABDOMEN AND PELVIS WITH CONTRAST
TECHNIQUE: Multidetector CT imaging of the abdomen and pelvis was performed
using the standard protocol following bolus administration of
intravenous contrast.
CONTRAST:  100mL OMNIPAQUE IOHEXOL 300 MG/ML  SOLN

[Series 3: a/p w/ 5mm · axial · 0.77mm/px · z∈[+710,+1130]mm · 13 of 94 slices shown, 15 images]
[im 5/94  soft-tissue]
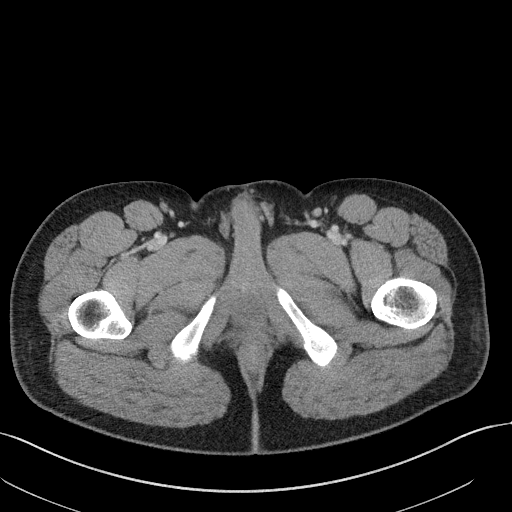
[im 5/94  bone]
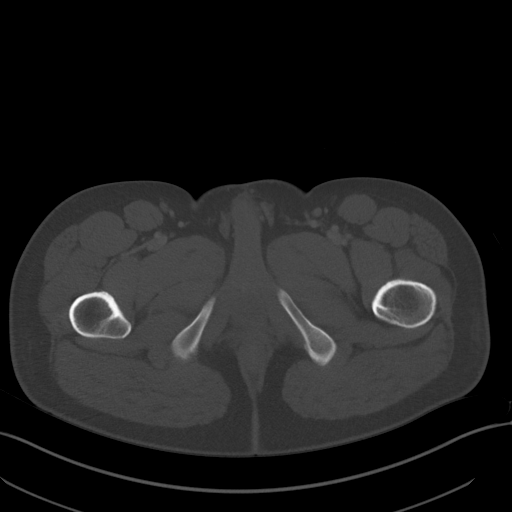
[im 14/94  soft-tissue]
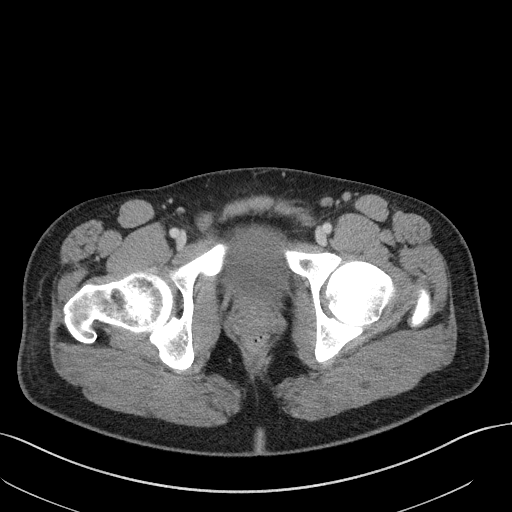
[im 19/94  soft-tissue]
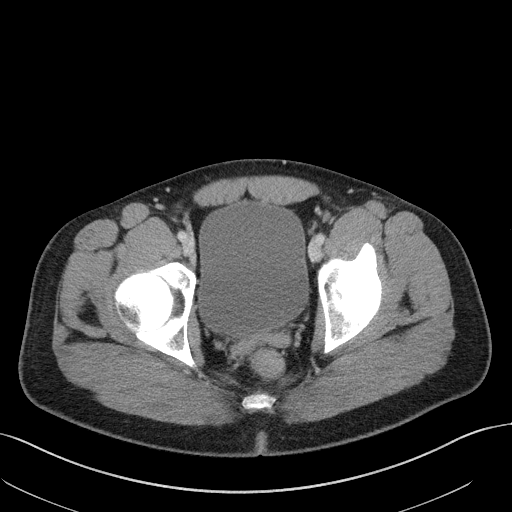
[im 28/94  soft-tissue]
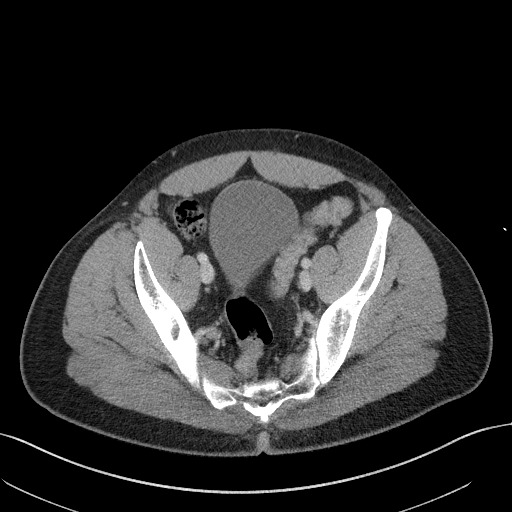
[im 33/94  soft-tissue]
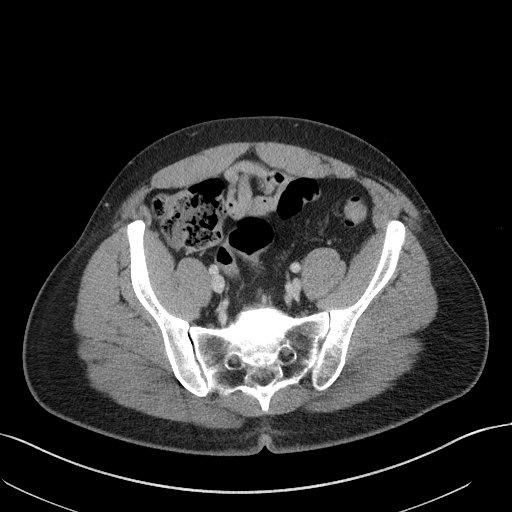
[im 42/94  soft-tissue]
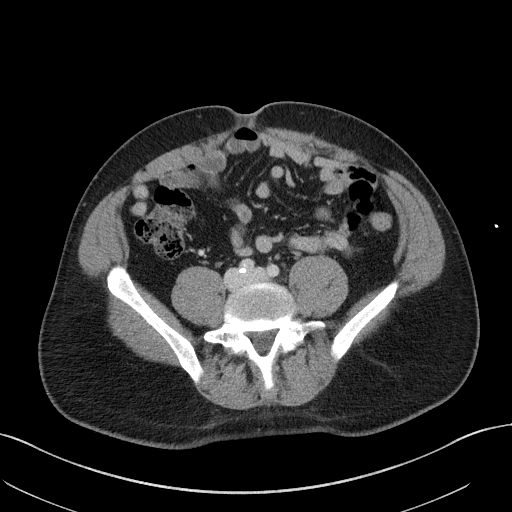
[im 47/94  soft-tissue]
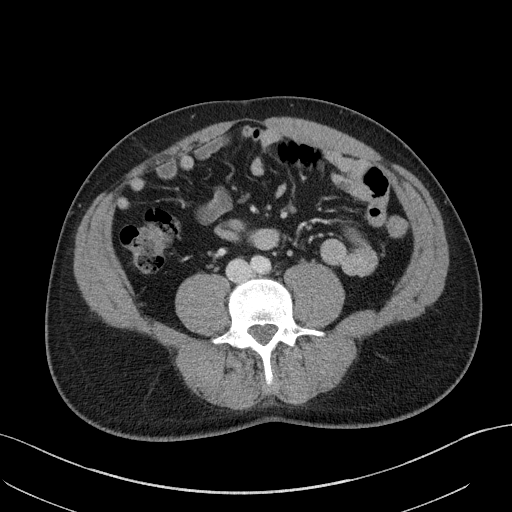
[im 52/94  soft-tissue]
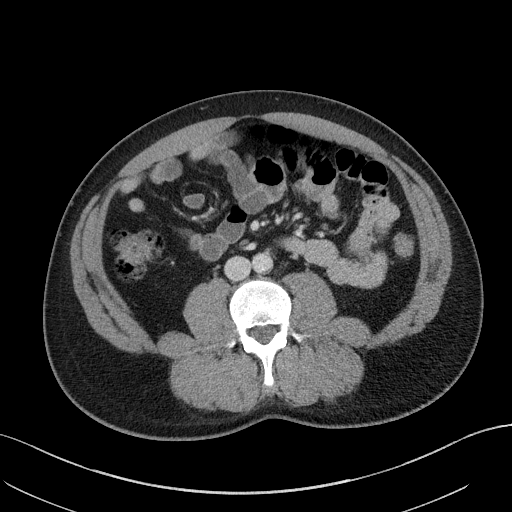
[im 61/94  soft-tissue]
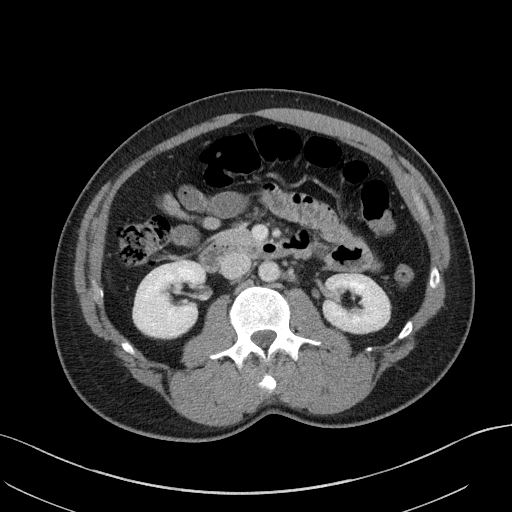
[im 61/94  bone]
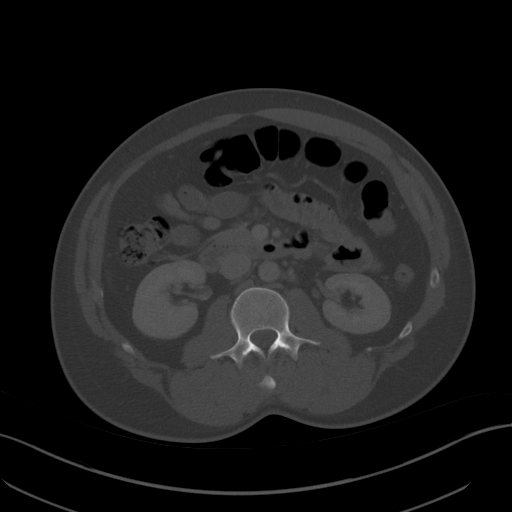
[im 66/94  soft-tissue]
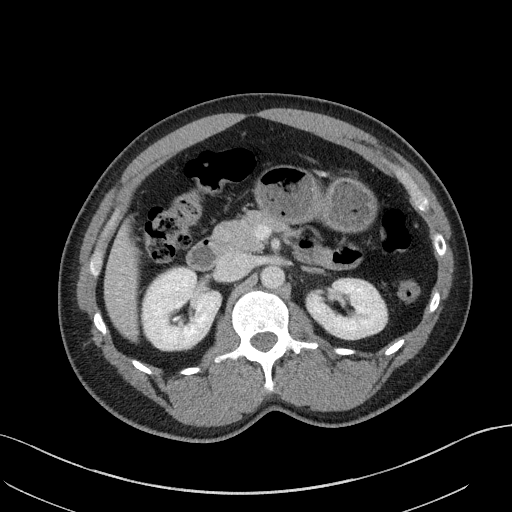
[im 75/94  soft-tissue]
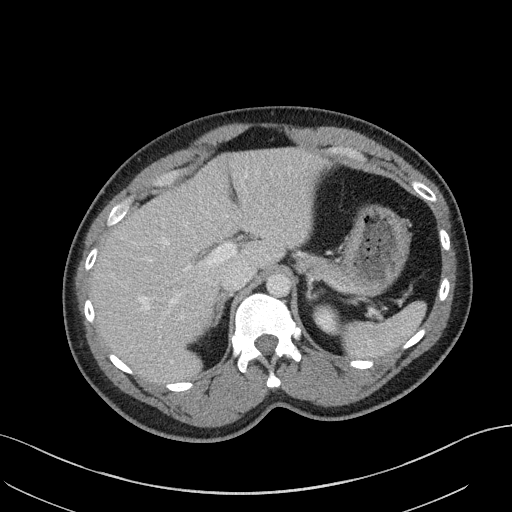
[im 80/94  soft-tissue]
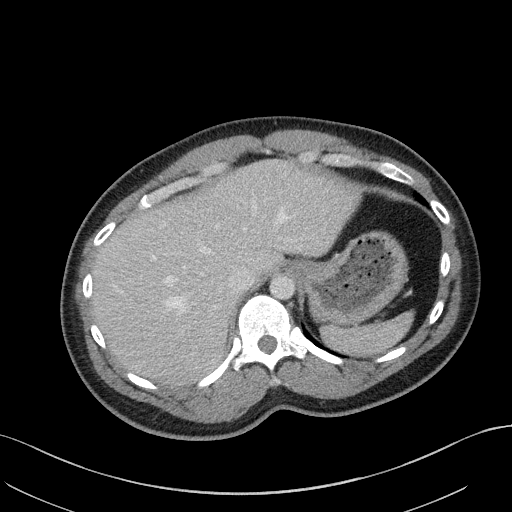
[im 89/94  soft-tissue]
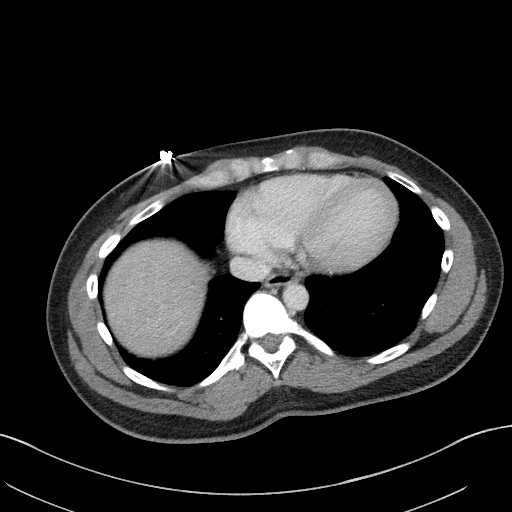

[Series 6: a/p w/ cor · coronal · 0.78mm/px · 3 of 140 slices shown]
[im 47/140  soft-tissue]
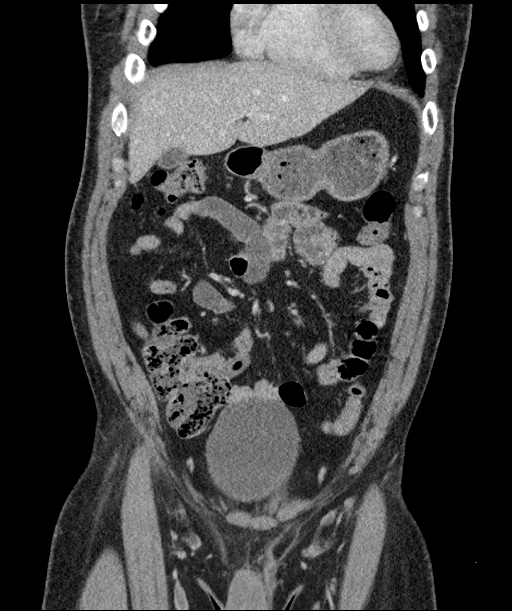
[im 62/140  soft-tissue]
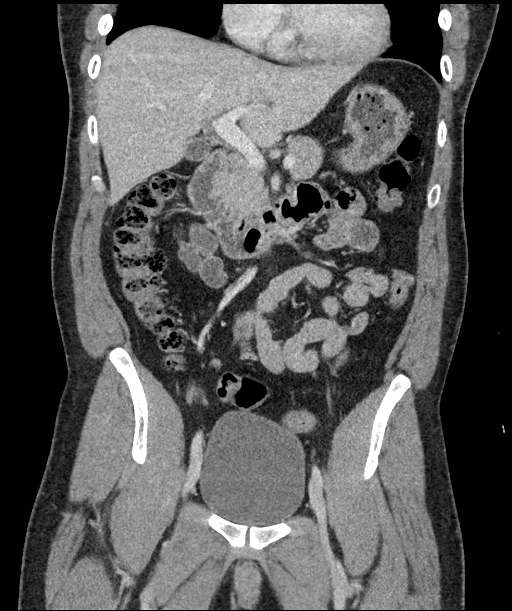
[im 78/140  soft-tissue]
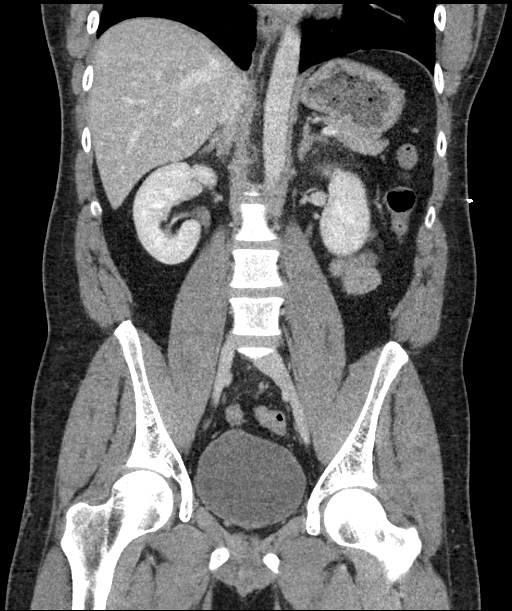

[16 of 46 positions shown; findings below may reference images not displayed]

FINDINGS: Lower chest: No basilar pneumothorax. No fracture of included lower
ribs. No pleural fluid.

Hepatobiliary: No hepatic injury or perihepatic hematoma.
Gallbladder is unremarkable.

Pancreas: No evidence of injury. No ductal dilatation or
inflammation.

Spleen: No splenic injury or perisplenic hematoma.

Adrenals/Urinary Tract: No adrenal hemorrhage or renal injury
identified. Homogeneous renal enhancement. No hydronephrosis.
Bladder is unremarkable.

Stomach/Bowel: No evidence of bowel or mesenteric injury. No bowel
wall thickening or inflammatory change. Normal appendix. Stomach is
unremarkable.

Vascular/Lymphatic: No vascular injury. Abdominal aorta and IVC are
intact. No retroperitoneal fluid. Minor aortic atherosclerosis. No
adenopathy.

Reproductive: Prostate is unremarkable.

Other: No confluent body wall contusion. No free fluid or free air.

Musculoskeletal: No fracture of the included ribs, thoracolumbar
spine, or bony pelvis.
IMPRESSION: No evidence of acute traumatic injury to the abdomen or pelvis.

## 2020-07-06 IMAGING — CR CHEST - 2 VIEW
2 series · 2 of 2 positions shown · non-contrast
Comparison: Chest radiograph dated 06/09/2018

CLINICAL DATA: 43-year-old male with chest pain.

EXAM:
CHEST - 2 VIEW

[chest pa]
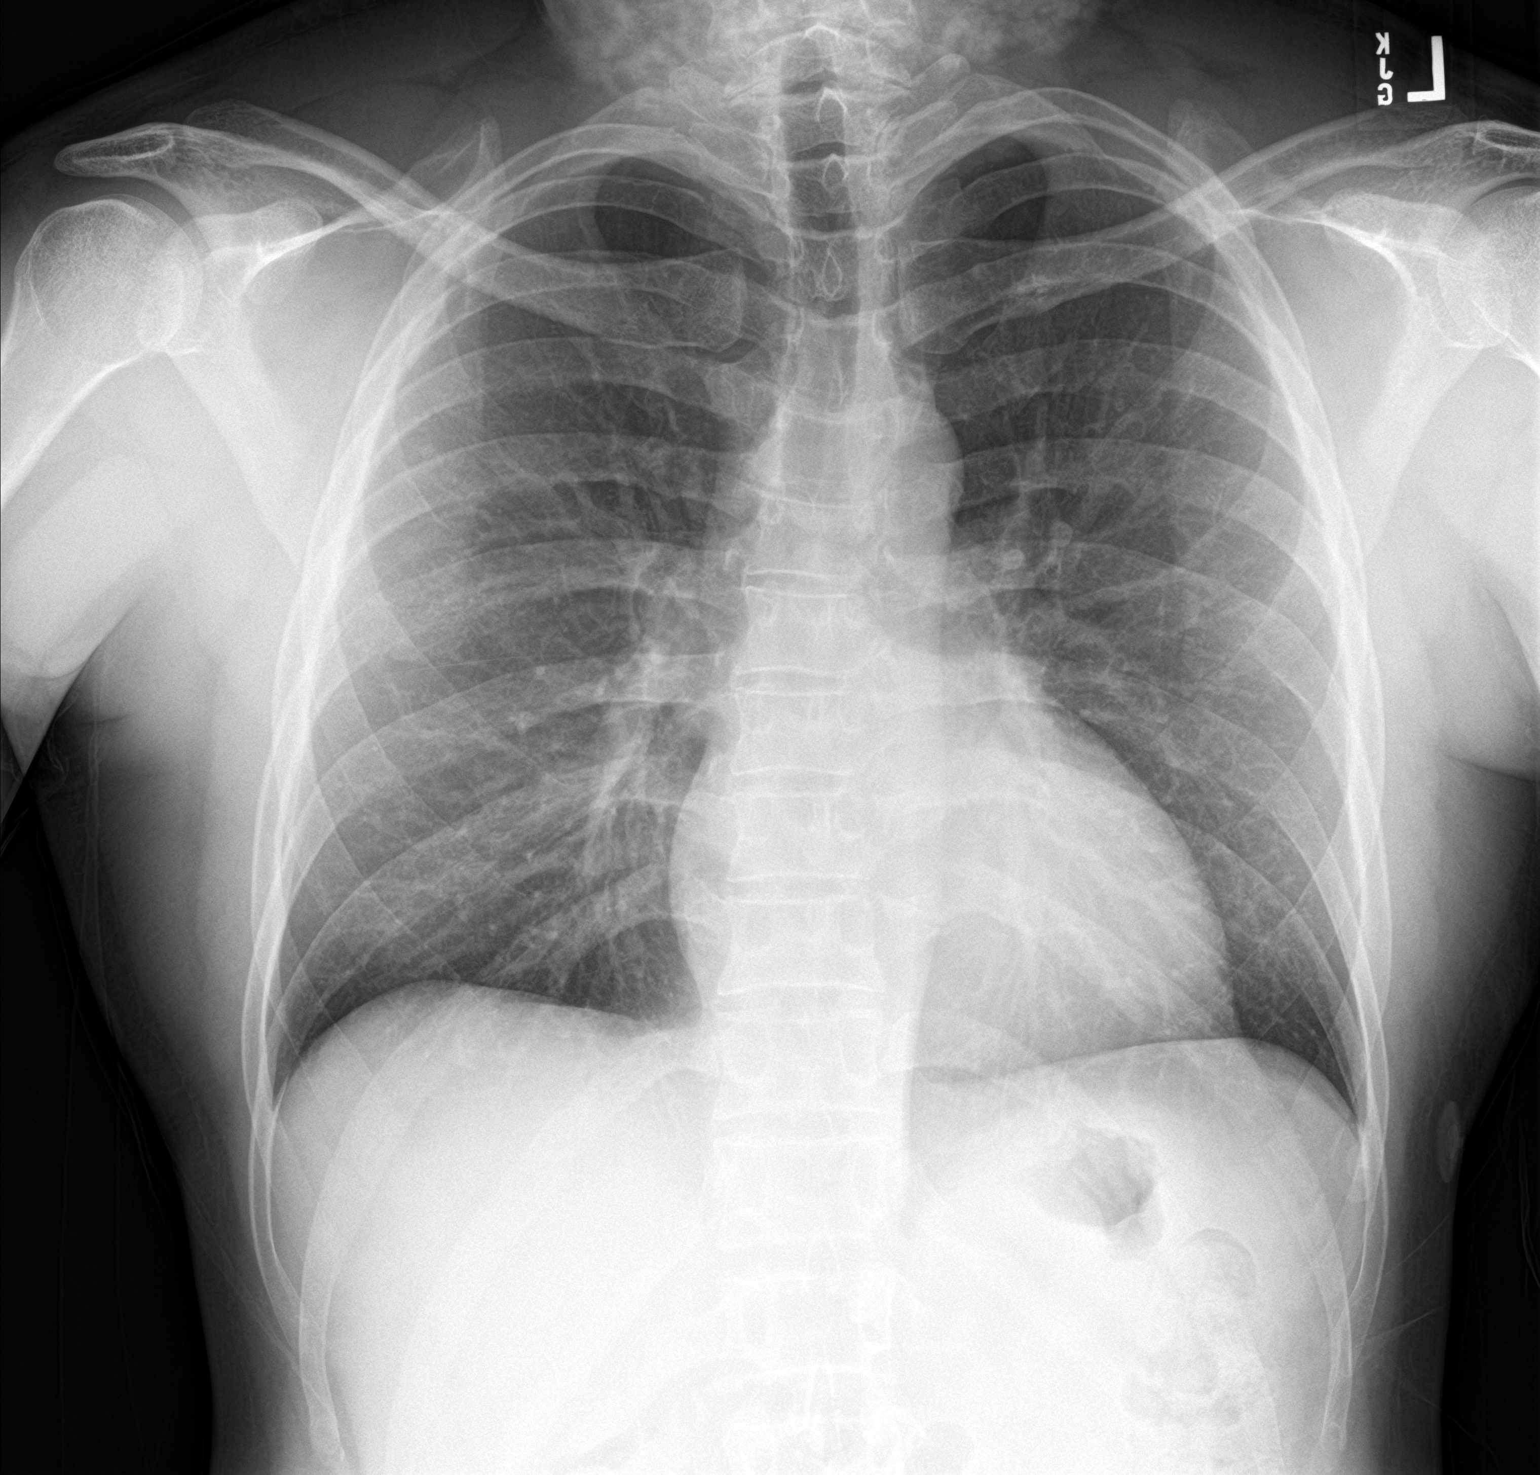

[chest lat]
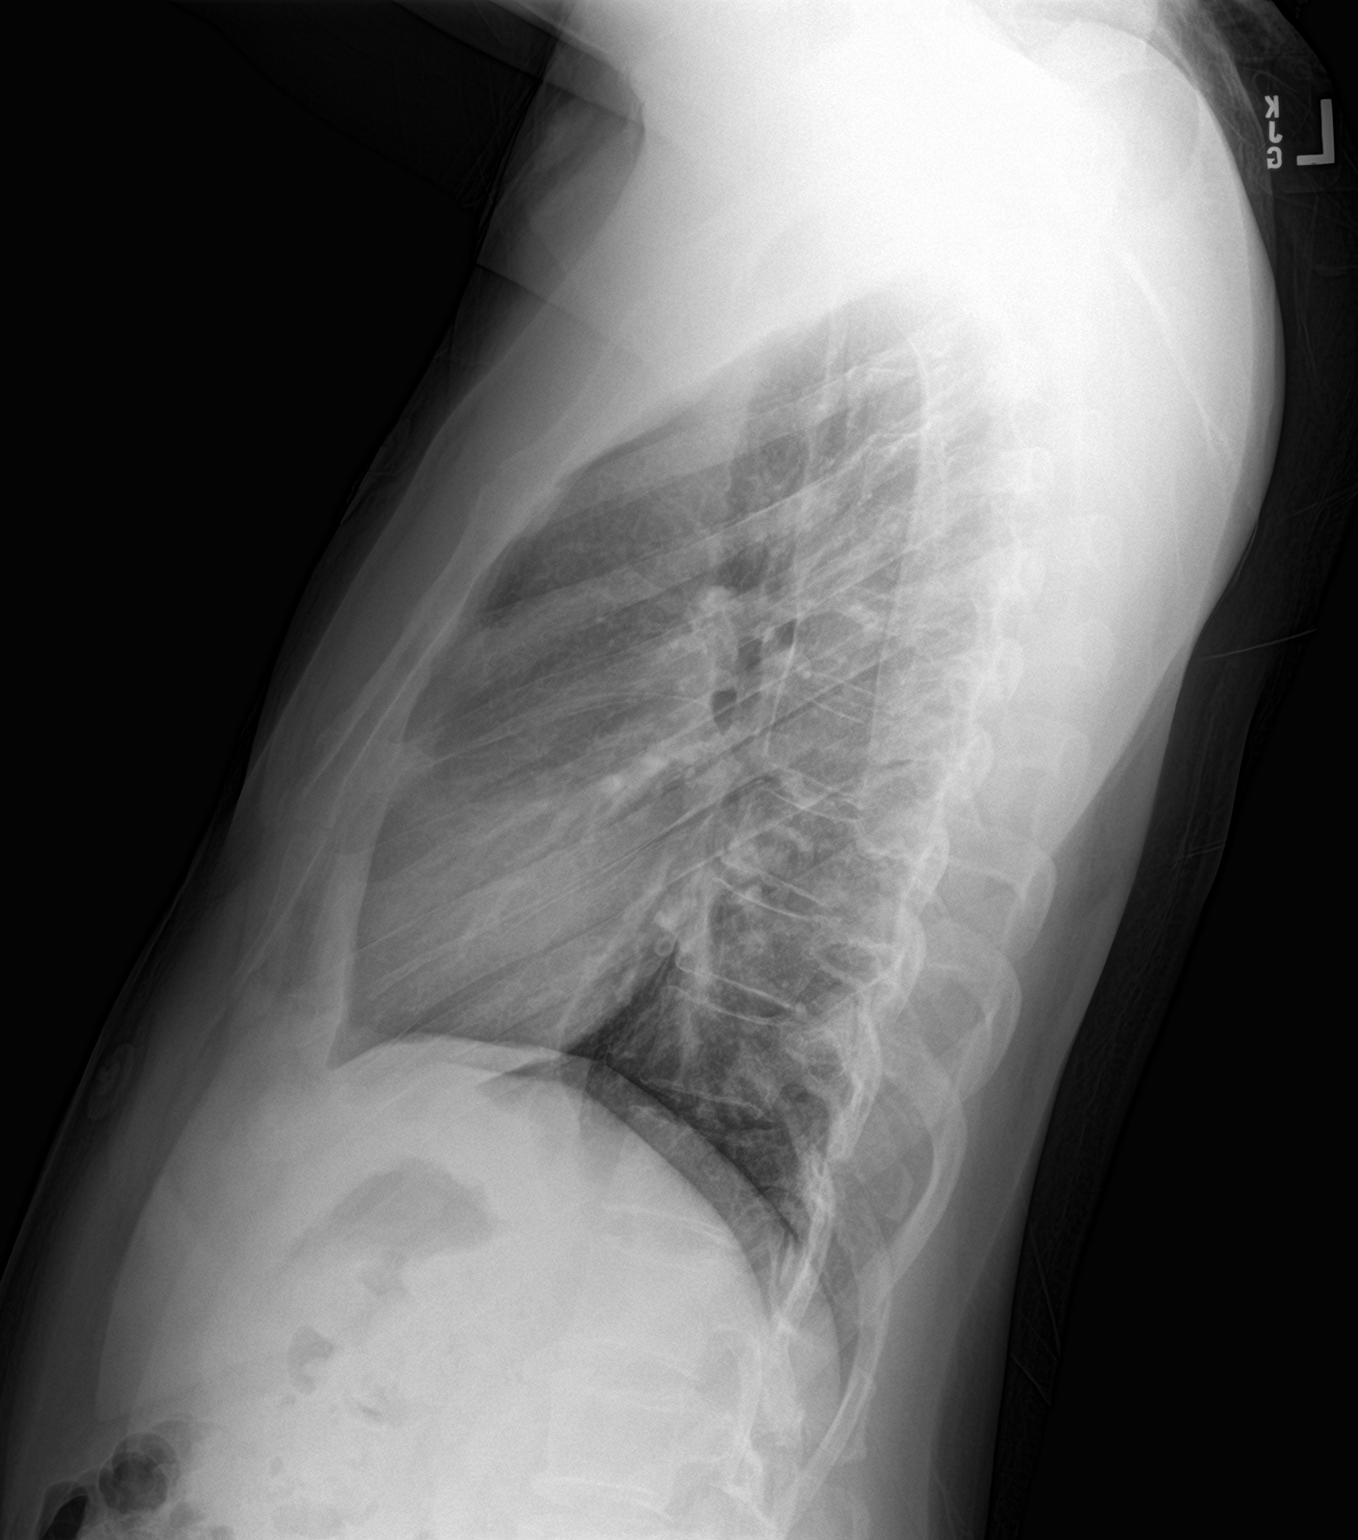

[2 of 2 positions shown; findings below may reference images not displayed]

FINDINGS: The heart size and mediastinal contours are within normal limits.
Both lungs are clear. The visualized skeletal structures are
unremarkable.
IMPRESSION: No active cardiopulmonary disease.

## 2021-03-22 ENCOUNTER — Other Ambulatory Visit: Payer: Self-pay

## 2021-03-22 ENCOUNTER — Emergency Department (HOSPITAL_COMMUNITY): Payer: Self-pay

## 2021-03-22 ENCOUNTER — Emergency Department (HOSPITAL_COMMUNITY)
Admission: EM | Admit: 2021-03-22 | Discharge: 2021-03-23 | Disposition: A | Discharge status: Home / Self Care | Payer: Self-pay | Attending: Emergency Medicine | Admitting: Emergency Medicine

## 2021-03-22 ENCOUNTER — Encounter (HOSPITAL_COMMUNITY): Payer: Self-pay

## 2021-03-22 ENCOUNTER — Ambulatory Visit
Admission: EM | Admit: 2021-03-22 | Discharge: 2021-03-22 | Disposition: A | Discharge status: Home / Self Care | Payer: Self-pay | Attending: Physician Assistant | Admitting: Physician Assistant

## 2021-03-22 DIAGNOSIS — J209 Acute bronchitis, unspecified: Secondary | ICD-10-CM

## 2021-03-22 DIAGNOSIS — I10 Essential (primary) hypertension: Secondary | ICD-10-CM | POA: Insufficient documentation

## 2021-03-22 DIAGNOSIS — F17210 Nicotine dependence, cigarettes, uncomplicated: Secondary | ICD-10-CM | POA: Insufficient documentation

## 2021-03-22 DIAGNOSIS — R0789 Other chest pain: Secondary | ICD-10-CM | POA: Insufficient documentation

## 2021-03-22 LAB — COMPREHENSIVE METABOLIC PANEL
ALT: 43 U/L (ref 0–44)
AST: 31 U/L (ref 15–41)
Albumin: 4.2 g/dL (ref 3.5–5.0)
Alkaline Phosphatase: 62 U/L (ref 38–126)
Anion gap: 7 (ref 5–15)
BUN: 12 mg/dL (ref 6–20)
CO2: 26 mmol/L (ref 22–32)
Calcium: 9.5 mg/dL (ref 8.9–10.3)
Chloride: 104 mmol/L (ref 98–111)
Creatinine, Ser: 0.97 mg/dL (ref 0.61–1.24)
GFR, Estimated: >60 mL/min (ref >60)
Glucose, Bld: 122 mg/dL — ABNORMAL HIGH (ref 70–99)
Potassium: 4.6 mmol/L (ref 3.5–5.1)
Sodium: 137 mmol/L (ref 135–145)
Total Bilirubin: 0.6 mg/dL (ref 0.3–1.2)
Total Protein: 7 g/dL (ref 6.5–8.1)

## 2021-03-22 LAB — CBC WITH DIFFERENTIAL/PLATELET
Abs Immature Granulocytes: 0.03 10*3/uL (ref 0.00–0.07)
Basophils Absolute: 0 10*3/uL (ref 0.0–0.1)
Basophils Relative: 0 %
Eosinophils Absolute: 0.2 10*3/uL (ref 0.0–0.5)
Eosinophils Relative: 2 %
HCT: 46.2 % (ref 39.0–52.0)
Hemoglobin: 15.3 g/dL (ref 13.0–17.0)
Immature Granulocytes: 0 %
Lymphocytes Relative: 46 %
Lymphs Abs: 3.6 10*3/uL (ref 0.7–4.0)
MCH: 30.1 pg (ref 26.0–34.0)
MCHC: 33.1 g/dL (ref 30.0–36.0)
MCV: 90.9 fL (ref 80.0–100.0)
Monocytes Absolute: 0.6 10*3/uL (ref 0.1–1.0)
Monocytes Relative: 7 %
Neutro Abs: 3.6 10*3/uL (ref 1.7–7.7)
Neutrophils Relative %: 45 %
Platelets: 274 10*3/uL (ref 150–400)
RBC: 5.08 MIL/uL (ref 4.22–5.81)
RDW: 13.1 % (ref 11.5–15.5)
WBC: 8 10*3/uL (ref 4.0–10.5)
nRBC: 0 % (ref 0.0–0.2)

## 2021-03-22 LAB — LIPASE, BLOOD: Lipase: 30 U/L (ref 11–51)

## 2021-03-22 LAB — TROPONIN I (HIGH SENSITIVITY): Troponin I (High Sensitivity): 5 ng/L (ref <18)

## 2021-03-22 MED ORDER — ACETAMINOPHEN 500 MG PO TABS
1000.0000 mg | ORAL_TABLET | Freq: Once | ORAL | Status: AC
  Administered 2021-03-23: 1000 mg via ORAL
  Filled 2021-03-22: qty 2

## 2021-03-22 MED ORDER — PREDNISONE 20 MG PO TABS: 40.0000 mg | ORAL_TABLET | Freq: Every day | ORAL | 0 refills | Status: AC

## 2021-03-22 MED ORDER — KETOROLAC TROMETHAMINE 30 MG/ML IJ SOLN
30.0000 mg | Freq: Once | INTRAMUSCULAR | Status: AC
  Administered 2021-03-22: 30 mg via INTRAMUSCULAR

## 2021-03-22 MED ORDER — LIDOCAINE 5 % EX PTCH
1.0000 | MEDICATED_PATCH | CUTANEOUS | Status: DC
  Administered 2021-03-23: 1 via TRANSDERMAL
  Filled 2021-03-22: qty 1

## 2021-03-22 NOTE — ED Provider Notes (Signed)
[facility]    CSN: [id number] Arrival date & time: 03/22/21  1838      History   Chief Complaint Chief Complaint  Patient presents with   Cough    HPI Ronald Valencia is a 45 y.o. male.   Patient here today for evaluation of cough, and pain with cough to right rib area that started today.  He reports he has had cough for several days.  He did have upper respiratory infection about 3 weeks ago with fever but none since that time.  He reports that deep breathing also causes pain to his right rib area.  The history is provided by the patient.  Cough Associated symptoms: chest pain (rib area) and shortness of breath   Associated symptoms: no chills, no eye discharge and no fever    Past Medical History:  Diagnosis Date   Hypertension     There are no problems to display for this patient.   History reviewed. No pertinent surgical history.     Home Medications    Prior to Admission medications   Medication Sig Start Date End Date Taking? Authorizing Provider  predniSONE (DELTASONE) 20 MG tablet Take 2 tablets (40 mg total) by mouth daily with breakfast for 5 days. 03/22/21 03/27/21 Yes Tomi Bamberger, PA-C    Family History Family History  Family history unknown: Yes    Social History Social History   Tobacco Use   Smoking status: Every Day    Packs/day: 0.25    Types: Cigarettes   Smokeless tobacco: Never  Vaping Use   Vaping Use: Never used  Substance Use Topics   Alcohol use: Yes   Drug use: No     Allergies   Patient has no known allergies.   Review of Systems Review of Systems  Constitutional:  Negative for chills and fever.  Eyes:  Negative for discharge and redness.  Respiratory:  Positive for cough and shortness of breath.   Cardiovascular:  Positive for chest pain (rib area).  Skin:  Positive for color change and wound.  Neurological:  Negative for numbness.    Physical Exam Triage Vital Signs ED Triage Vitals   Enc Vitals Group     BP 03/22/21 1848 (!) 155/95     Pulse Rate 03/22/21 1848 (!) 102     Resp 03/22/21 1848 18     Temp 03/22/21 1848 98.1 F (36.7 C)     Temp Source 03/22/21 1848 Oral     SpO2 03/22/21 1848 98 %     Weight --      Height --      Head Circumference --      Peak Flow --      Pain Score 03/22/21 1849 5     Pain Loc --      Pain Edu? --      Excl. in GC? --    No data found.  Updated Vital Signs BP (!) 155/95 (BP Location: Right Arm)    Pulse (!) 102    Temp 98.1 F (36.7 C) (Oral)    Resp 18    SpO2 98%     Physical Exam Vitals and nursing note reviewed.  Constitutional:      General: He is not in acute distress.    Appearance: Normal appearance. He is not ill-appearing.  HENT:     Head: Normocephalic and atraumatic.  Eyes:     Conjunctiva/sclera: Conjunctivae normal.  Cardiovascular:     Rate  and Rhythm: Normal rate and regular rhythm.     Heart sounds: Normal heart sounds. No murmur heard. Pulmonary:     Effort: Pulmonary effort is normal. No respiratory distress.     Breath sounds: Normal breath sounds. No wheezing, rhonchi or rales.  Chest:     Chest wall: Tenderness (right lateral lower ribs) present.  Neurological:     Mental Status: He is alert.  Psychiatric:        Mood and Affect: Mood normal.        Behavior: Behavior normal.        Thought Content: Thought content normal.     UC Treatments / Results  Labs (all labs ordered are listed, but only abnormal results are displayed) Labs Reviewed - No data to display  EKG   Radiology No results found.  Procedures Procedures (including critical care time)  Medications Ordered in UC Medications  ketorolac (TORADOL) 30 MG/ML injection 30 mg (30 mg Intramuscular Given 03/22/21 1945)    Initial Impression / Assessment and Plan / UC Course  I have reviewed the triage vital signs and the nursing notes.  Pertinent labs & imaging results that were available during my care of the  patient were reviewed by me and considered in my medical decision making (see chart for details).  Prednisone prescribed for suspected bronchitis as well as costochondritis.  Recommended follow-up with any continued symptoms and encouraged evaluation in the emergency department if symptoms worsen.  Patient and significant other expressed understanding.  Toradol injection administered in office to hopefully help with pain.   Final Clinical Impressions(s) / UC Diagnoses   Final diagnoses:  Acute bronchitis, unspecified organism   Discharge Instructions   None    ED Prescriptions     Medication Sig Dispense Auth. Provider   predniSONE (DELTASONE) 20 MG tablet Take 2 tablets (40 mg total) by mouth daily with breakfast for 5 days. 10 tablet Tomi Bamberger, PA-C      PDMP not reviewed this encounter.   Tomi Bamberger, PA-C 03/22/21 1958

## 2021-03-22 NOTE — ED Triage Notes (Signed)
Pt arrives POV for eval of R sided CP, worse w/ inspiration and movement. Also reports associated nasal congestion. States CP onset 5 days PTA, however today at 1800 it became unbearable.

## 2021-03-22 NOTE — ED Triage Notes (Signed)
Pt c/o cough, chest pain that hurts when he coughs or deep breathes, nasal congestion.   Denies sore throat, headache, body aches or chills, nausea, vomiting, diarrhea, constipation, fatigue.   Onset ~ 5 days ago.

## 2021-03-22 NOTE — ED Provider Notes (Signed)
[facility] EMERGENCY DEPARTMENT Provider Note   CSN: [id number] Arrival date & time: 03/22/21  2038     History Chief Complaint  Patient presents with   Chest Pain    Ronald Valencia is a 45 y.o. male.  The history is provided by the patient.  Chest Pain Ronald Valencia is a 45 y.o. male who presents to the Emergency Department complaining of chest pain.  He presents to the emergency department complaining of 5 days of right axillary chest pain.  He states that about 2 weeks ago he developed cough and initially did have some subjective fevers at that time.  The fevers have since resolved but his cough has persisted.  He states that he is not quite able to bring up any fluid when he coughs.  Pain is worse with moving, breathing.  No associated nausea, vomiting, diarrhea.  He did develop abdominal discomfort at time of ED arrival but that was due to eating a sandwich recently.  He has a history of hypertension, no additional medical problems.  No personal or family history of cardiac disease, DVT/PE.  No prior similar symptoms.  No known injuries.    Past Medical History:  Diagnosis Date   Hypertension     There are no problems to display for this patient.   History reviewed. No pertinent surgical history.     Family History  Family history unknown: Yes    Social History   Tobacco Use   Smoking status: Every Day    Packs/day: 0.25    Types: Cigarettes   Smokeless tobacco: Never  Vaping Use   Vaping Use: Never used  Substance Use Topics   Alcohol use: Yes   Drug use: No    Home Medications Prior to Admission medications   Medication Sig Start Date End Date Taking? Authorizing Provider  predniSONE (DELTASONE) 20 MG tablet Take 2 tablets (40 mg total) by mouth daily with breakfast for 5 days. 03/22/21 03/27/21  Tomi Bamberger, PA-C    Allergies    Patient has no known allergies.  Review of Systems   Review of Systems   Cardiovascular:  Positive for chest pain.  All other systems reviewed and are negative.  Physical Exam Updated Vital Signs BP (!) 146/106    Pulse 93    Temp 98.1 F (36.7 C) (Oral)    Resp 20    Ht 6\' 1"  (1.854 m)    Wt 84 kg    SpO2 98%    BMI 24.43 kg/m   Physical Exam Vitals and nursing note reviewed.  Constitutional:      Appearance: He is well-developed.  HENT:     Head: Normocephalic and atraumatic.  Cardiovascular:     Rate and Rhythm: Normal rate and regular rhythm.     Heart sounds: No murmur heard. Pulmonary:     Effort: Pulmonary effort is normal. No respiratory distress.     Breath sounds: Normal breath sounds.     Comments: Tenderness to palpation over the right axillary chest wall just inferior to the nipple line.  No overlying rash Abdominal:     Palpations: Abdomen is soft.     Tenderness: There is no abdominal tenderness. There is no guarding or rebound.  Musculoskeletal:        General: No swelling or tenderness.  Skin:    General: Skin is warm and dry.  Neurological:     Mental Status: He is alert and oriented to person,  place, and time.  Psychiatric:        Behavior: Behavior normal.    ED Results / Procedures / Treatments   Labs (all labs ordered are listed, but only abnormal results are displayed) Labs Reviewed  COMPREHENSIVE METABOLIC PANEL - Abnormal; Notable for the following components:      Result Value   Glucose, Bld 122 (*)    All other components within normal limits  LIPASE, BLOOD  CBC WITH DIFFERENTIAL/PLATELET  D-DIMER, QUANTITATIVE  TROPONIN I (HIGH SENSITIVITY)  TROPONIN I (HIGH SENSITIVITY)    EKG EKG Interpretation  Date/Time:  Thursday March 22 2021 20:35:33 EST Ventricular Rate:  96 PR Interval:  156 QRS Duration: 74 QT Interval:  334 QTC Calculation: 421 R Axis:   92 Text Interpretation: Normal sinus rhythm Right atrial enlargement Rightward axis Cannot rule out Anterior infarct , age undetermined Abnormal ECG  Confirmed by Tilden Fossa [phone]) on 03/22/2021 11:30:14 PM  Radiology DG Ribs Unilateral W/Chest Right  Result Date: 03/22/2021 CLINICAL DATA:  Coughing and chest pain. EXAM: RIGHT RIBS AND CHEST - 3+ VIEW COMPARISON:  PA and lateral 11/01/2018 FINDINGS: No displaced fracture or other bone lesions are seen involving the ribs. There is no evidence of pneumothorax or pleural effusion. Both lungs are clear. Heart size and mediastinal contours are within normal limits. IMPRESSION: No displaced rib fractures or acute chest findings or interval changes. Electronically Signed   By: Almira Bar M.D.   On: 03/22/2021 21:48    Procedures Procedures   Medications Ordered in ED Medications  lidocaine (LIDODERM) 5 % 1 patch (1 patch Transdermal Patch Applied 03/23/21 0004)  acetaminophen (TYLENOL) tablet 1,000 mg (1,000 mg Oral Given 03/23/21 0004)    ED Course  I have reviewed the triage vital signs and the nursing notes.  Pertinent labs & imaging results that were available during my care of the patient were reviewed by me and considered in my medical decision making (see chart for details).    MDM Rules/Calculators/A&P                         Patient here for evaluation of right-sided chest pain, reproducible on examination.  EKG is without acute ischemic changes and troponins are negative x2.  He is low risk for PE and D-dimer is negative.  Doubt acute PE.  No evidence of acute pneumonia.  Discussed with patient likely chest wall pain in setting of recent cough and upper respiratory infection.  Discussed home care with rest, over-the-counter analgesics.  Return if worsening symptoms.  Presentation is not consistent with ACS, PE, dissection, pneumonia, sepsis, referred abdominal process.    Final Clinical Impression(s) / ED Diagnoses Final diagnoses:  Chest wall pain    Rx / DC Orders ED Discharge Orders     None        Tilden Fossa, MD 03/23/21 [phone]

## 2021-03-22 NOTE — ED Provider Notes (Signed)
Emergency Medicine Provider Triage Evaluation Note  Ronald Valencia , a 45 y.o. male  was evaluated in triage.  Pt complains of right-sided chest pain.  He states that his pain worsens with inspiration and movement.  He has been sick and coughing for about 3 weeks ago.  He was seen at urgent care today, did not have any x-rays.  He is concerned.  He does note that he felt a pop at 1 point while coughing and is concerned he may have broken a rib.  He denies any ongoing fevers.  Review of Systems  Positive: Cough, see above Negative:   Physical Exam  BP (!) 170/101 (BP Location: Right Arm)    Temp 98.7 F (37.1 C)    Resp 17    Ht 6\' 1"  (1.854 m)    Wt 84 kg    SpO2 98%    BMI 24.43 kg/m  Gen:   Awake, no distress   Resp:  Normal effort  MSK:   Moves extremities without difficulty  Other:  Normal speech.  Very TTP over the right chest wall.  Pain worsens with any movement including respirations.  He has mild LUQ abdominal TTP but feels that is more of making his chest wall move and causing him pain.   Medical Decision Making  Medically screening exam initiated at 9:05 PM.  Appropriate orders placed.  Robbin Escher was informed that the remainder of the evaluation will be completed by another provider, this initial triage assessment does not replace that evaluation, and the importance of remaining in the ED until their evaluation is complete.  Note: Portions of this report may have been transcribed using voice recognition software. Every effort was made to ensure accuracy; however, inadvertent computerized transcription errors may be present  Clinically I suspect patient's pain is chest wall pain from his ongoing coughing.  However his hypertension and degree of pain will obtain labs.  Note: Portions of this report may have been transcribed using voice recognition software. Every effort was made to ensure accuracy; however, inadvertent computerized transcription errors may be  present     Michele Rockers 03/22/21 2108    2109, MD 03/22/21 2340

## 2021-03-23 LAB — TROPONIN I (HIGH SENSITIVITY): Troponin I (High Sensitivity): 5 ng/L (ref <18)

## 2021-03-23 LAB — D-DIMER, QUANTITATIVE: D-Dimer, Quant: <0.27 ug/mL-FEU (ref 0.00–0.50)

## 2021-03-23 NOTE — Discharge Instructions (Addendum)
You can use lidoderm cream or patches, available over the counter according to label instructions as needed for pain.    You may also use tylenol and ibuprofen, available over the counter according to label instructions as needed for pain.

## 2021-03-23 NOTE — ED Notes (Signed)
Pt verbalized understanding of d/c instructions, meds, and followup care. Denies questions. VSS, no distress noted. Steady gait to exit with all belongings.  ?

## 2022-12-20 ENCOUNTER — Ambulatory Visit
Admission: EM | Admit: 2022-12-20 | Discharge: 2022-12-20 | Disposition: A | Discharge status: Home / Self Care | Payer: 59 | Attending: Physician Assistant | Admitting: Physician Assistant

## 2022-12-20 DIAGNOSIS — Z113 Encounter for screening for infections with a predominantly sexual mode of transmission: Secondary | ICD-10-CM | POA: Insufficient documentation

## 2022-12-20 NOTE — ED Provider Notes (Signed)
[facility]    CSN: [id number] Arrival date & time: 12/20/22  1053      History   Chief Complaint Chief Complaint  Patient presents with   SEXUALLY TRANSMITTED DISEASE    Testing Request    HPI Alondra Treon is a 47 y.o. male.   Patient here today for evaluation of possible STD.  He reports he has had some penile discharge as well as penile lesions.  He notes that lesions are painful.  He denies history of herpes.  He denies any dysuria or hematuria.  The history is provided by the patient.    Past Medical History:  Diagnosis Date   Hypertension     There are no problems to display for this patient.   History reviewed. No pertinent surgical history.     Home Medications    Prior to Admission medications   Not on File    Family History Family History  Family history unknown: Yes    Social History Social History   Tobacco Use   Smoking status: Every Day    Current packs/day: 0.25    Types: Cigarettes   Smokeless tobacco: Never  Vaping Use   Vaping status: Never Used  Substance Use Topics   Alcohol use: Yes    Comment: Occassionally.   Drug use: No     Allergies   Patient has no known allergies.   Review of Systems Review of Systems  Constitutional:  Negative for chills and fever.  Eyes:  Negative for discharge and redness.  Respiratory:  Negative for shortness of breath.   Genitourinary:  Positive for genital sores and penile discharge. Negative for dysuria.  Neurological:  Negative for numbness.     Physical Exam Triage Vital Signs ED Triage Vitals  Encounter Vitals Group     BP 12/20/22 1112 (!) 141/87     Systolic BP Percentile --      Diastolic BP Percentile --      Pulse Rate 12/20/22 1112 88     Resp 12/20/22 1112 18     Temp 12/20/22 1112 98.5 F (36.9 C)     Temp Source 12/20/22 1112 Oral     SpO2 12/20/22 1112 98 %     Weight 12/20/22 1111 185 lb (83.9 kg)     Height 12/20/22 1111 6' (1.829 m)      Head Circumference --      Peak Flow --      Pain Score 12/20/22 1109 0     Pain Loc --      Pain Education --      Exclude from Growth Chart --    No data found.  Updated Vital Signs BP (!) 140/89 (BP Location: Left Arm)   Pulse 88   Temp 98.5 F (36.9 C) (Oral)   Resp 18   Ht 6' (1.829 m)   Wt 185 lb (83.9 kg)   SpO2 98%   BMI 25.09 kg/m      Physical Exam Vitals and nursing note reviewed.  Constitutional:      General: He is not in acute distress.    Appearance: Normal appearance. He is not ill-appearing.  HENT:     Head: Normocephalic and atraumatic.  Eyes:     Conjunctiva/sclera: Conjunctivae normal.  Cardiovascular:     Rate and Rhythm: Normal rate.  Pulmonary:     Effort: Pulmonary effort is normal. No respiratory distress.  Genitourinary:    Comments: Chaperone: Hali Marry, CMA  Scattered ulcerations to penis distsally Neurological:     Mental Status: He is alert.  Psychiatric:        Mood and Affect: Mood normal.        Behavior: Behavior normal.        Thought Content: Thought content normal.      UC Treatments / Results  Labs (all labs ordered are listed, but only abnormal results are displayed) Labs Reviewed  HSV CULTURE AND TYPING  HEPATITIS PANEL, ACUTE  RPR  HIV ANTIBODY (ROUTINE TESTING W REFLEX)  CYTOLOGY, (ORAL, ANAL, URETHRAL) ANCILLARY ONLY    EKG   Radiology No results found.  Procedures Procedures (including critical care time)  Medications Ordered in UC Medications - No data to display  Initial Impression / Assessment and Plan / UC Course  I have reviewed the triage vital signs and the nursing notes.  Pertinent labs & imaging results that were available during my care of the patient were reviewed by me and considered in my medical decision making (see chart for details).    STD screening ordered including HSV culture.  Will await results further recommendation but encouraged abstinence from sexual activity while  awaiting results.  Final Clinical Impressions(s) / UC Diagnoses   Final diagnoses:  Screening examination for STD (sexually transmitted disease)   Discharge Instructions   None    ED Prescriptions   None    PDMP not reviewed this encounter.   Tomi Bamberger, PA-C 12/20/22 [phone]

## 2022-12-20 NOTE — ED Triage Notes (Signed)
"  I think I may have gotten a STI". "The girl said she had a bacteria or something". Currently having "penis discharge, white in color". No dysuria. No pain.

## 2022-12-21 LAB — HIV ANTIBODY (ROUTINE TESTING W REFLEX): HIV Screen 4th Generation wRfx: NONREACTIVE

## 2022-12-21 LAB — RPR: RPR Ser Ql: NONREACTIVE

## 2022-12-23 LAB — CYTOLOGY, (ORAL, ANAL, URETHRAL) ANCILLARY ONLY
Chlamydia: NEGATIVE
Comment: NEGATIVE
Comment: NEGATIVE
Comment: NORMAL
Neisseria Gonorrhea: POSITIVE — AB
Trichomonas: POSITIVE — AB

## 2022-12-24 LAB — SPECIMEN STATUS REPORT

## 2022-12-24 LAB — HSV CULTURE AND TYPING

## 2022-12-25 ENCOUNTER — Ambulatory Visit
Admission: EM | Admit: 2022-12-25 | Discharge: 2022-12-25 | Disposition: A | Discharge status: Home / Self Care | Payer: 59 | Attending: Physician Assistant | Admitting: Physician Assistant

## 2022-12-25 ENCOUNTER — Telehealth: Payer: Self-pay

## 2022-12-25 DIAGNOSIS — A549 Gonococcal infection, unspecified: Secondary | ICD-10-CM

## 2022-12-25 DIAGNOSIS — A599 Trichomoniasis, unspecified: Secondary | ICD-10-CM

## 2022-12-25 DIAGNOSIS — B009 Herpesviral infection, unspecified: Secondary | ICD-10-CM

## 2022-12-25 MED ORDER — CEFTRIAXONE SODIUM 1 G IJ SOLR
1.0000 g | Freq: Once | INTRAMUSCULAR | Status: AC
  Administered 2022-12-25: 1 g via INTRAMUSCULAR

## 2022-12-25 MED ORDER — VALACYCLOVIR HCL 1 G PO TABS
500.0000 mg | ORAL_TABLET | Freq: Three times a day (TID) | ORAL | 0 refills | Status: AC
Start: 2022-12-25 — End: 2023-01-01

## 2022-12-25 MED ORDER — METRONIDAZOLE 500 MG PO TABS: 500.0000 mg | ORAL_TABLET | Freq: Two times a day (BID) | ORAL | 0 refills | Status: DC

## 2022-12-25 NOTE — Telephone Encounter (Signed)
Attempted to contact patient regarding the following:  hi, mrn [phone] Ronald J. called regarding results and a prescription. Phone number [phone]  No Answer. Left (general) message for patient to return call. VM not identified so unable to leave details.  B. Roten CMA

## 2022-12-25 NOTE — ED Triage Notes (Signed)
1G Rocephin Per [facility].

## 2022-12-25 NOTE — ED Triage Notes (Signed)
No Concerns, No Questions.   Discussed again:  metronidazole 500mg  BID for 7 days. He will also need rx for valacyclovir 500 mg TID for 7 days for treatment of HSV. Would recommend he notify partners of positive STD screening. Avoid any sexual contact until treatment complete. HSV is not a curable infection but one we manage with meds. He should follow up with any recurrent outbreaks.

## 2022-12-25 NOTE — Telephone Encounter (Signed)
Patient calling for the following results (already viewed in MyChart:  Hepatitis Panel (remains pending).     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 Performed At: [facility] [address] [place], [place] [id number] Jolene Schimke MD WU:[id number]   Please advise (any Rx and/or treatment needed).  Pharmacy confirmed: [facility] [address] [place].  Forwarded to R. Reggie Pile, Will call patient back thereafter.  B. Roten CMA

## 2022-12-25 NOTE — Telephone Encounter (Signed)
Notified patient of the following:   Tomi Bamberger, PA-C  [id number] minutes ago (12:50 PM)    Patient will need to be seen in office for 1G rocephin IM, and will need rx for metronidazole 500mg  BID for 7 days. He will also need rx for valacyclovir 500 mg TID for 7 days for treatment of HSV. Would recommend he notify partners of positive STD screening. Avoid any sexual contact until treatment complete. HSV is not a curable infection but one we manage with meds. He should follow up with any recurrent outbreaks.    Will discuss information (again) once in UC today (later today) for Injection. Patient will be coming today.

## 2023-05-09 ENCOUNTER — Other Ambulatory Visit: Payer: Self-pay

## 2023-09-02 ENCOUNTER — Ambulatory Visit
Admission: EM | Admit: 2023-09-02 | Discharge: 2023-09-02 | Disposition: A | Discharge status: Home / Self Care | Payer: Self-pay | Attending: Physician Assistant | Admitting: Physician Assistant

## 2023-09-02 DIAGNOSIS — S93401A Sprain of unspecified ligament of right ankle, initial encounter: Secondary | ICD-10-CM

## 2023-09-02 DIAGNOSIS — M25571 Pain in right ankle and joints of right foot: Secondary | ICD-10-CM

## 2023-09-02 DIAGNOSIS — M79671 Pain in right foot: Secondary | ICD-10-CM

## 2023-09-02 MED ORDER — IBUPROFEN 600 MG PO TABS
600.0000 mg | ORAL_TABLET | Freq: Three times a day (TID) | ORAL | 0 refills | Status: AC | PRN
Start: 2023-09-02 — End: ?

## 2023-09-02 NOTE — ED Provider Notes (Signed)
 [facility]    CSN: [id number] Arrival date & time: 09/02/23  1532      History   Chief Complaint Chief Complaint  Patient presents with   Ankle Pain    HPI Ronald Valencia is a 48 y.o. male.   Patient presents today with a 2-day history of right ankle pain.  He reports that he was at work when he inverted his ankle.  He did not have immediate pain was able to finish his shift but then when he woke up this morning (the day after) he had significant pain.  When he woke up this morning it was 8/9 on a 0 to pain scale but is currently slightly improved to 5/6, localized to lateral ankle with radiation into foot, described as sharp, no alleviating factors identified.  He has tried Aleve without improvement of symptoms.  He attempted to go to work today but had to go home because he had difficulty ambulating due to the pain.  Denies any previous injury or surgery involving his ankle.  He denies any numbness or paresthesias in his foot.    Past Medical History:  Diagnosis Date   Hypertension     There are no active problems to display for this patient.   History reviewed. No pertinent surgical history.     Home Medications    Prior to Admission medications   Medication Sig Start Date End Date Taking? Authorizing Provider  ibuprofen  (ADVIL ) 600 MG tablet Take 1 tablet (600 mg total) by mouth every 8 (eight) hours as needed. 09/02/23  Yes Xyon Lukasik, Betsey Brow, PA-C    Family History Family History  Family history unknown: Yes    Social History Social History   Tobacco Use   Smoking status: Every Day    Current packs/day: 0.25    Types: Cigarettes   Smokeless tobacco: Never  Vaping Use   Vaping status: Never Used  Substance Use Topics   Alcohol use: Not Currently    Comment: Occassionally.   Drug use: No     Allergies   Patient has no known allergies.   Review of Systems Review of Systems  Constitutional:  Positive for activity change. Negative  for appetite change, fatigue and fever.  Musculoskeletal:  Positive for arthralgias, gait problem and joint swelling. Negative for myalgias.  Skin:  Negative for color change and wound.  Neurological:  Negative for weakness and numbness.     Physical Exam Triage Vital Signs ED Triage Vitals  Encounter Vitals Group     BP 09/02/23 1720 (!) 150/97     Systolic BP Percentile --      Diastolic BP Percentile --      Pulse Rate 09/02/23 1720 82     Resp 09/02/23 1720 18     Temp 09/02/23 1720 97.9 F (36.6 C)     Temp Source 09/02/23 1720 Oral     SpO2 09/02/23 1720 97 %     Weight 09/02/23 1719 195 lb (88.5 kg)     Height 09/02/23 1719 6\' 1"  (1.854 m)     Head Circumference --      Peak Flow --      Pain Score 09/02/23 1719 5     Pain Loc --      Pain Education --      Exclude from Growth Chart --    No data found.  Updated Vital Signs BP (!) 150/97 (BP Location: Left Arm)   Pulse 82   Temp  97.9 F (36.6 C) (Oral)   Resp 18   Ht 6\' 1"  (1.854 m)   Wt 195 lb (88.5 kg)   SpO2 97%   BMI 25.73 kg/m   Visual Acuity Right Eye Distance:   Left Eye Distance:   Bilateral Distance:    Right Eye Near:   Left Eye Near:    Bilateral Near:     Physical Exam Vitals reviewed.  Constitutional:      General: He is awake.     Appearance: Normal appearance. He is well-developed. He is not ill-appearing.     Comments: Very pleasant male appears stated age in no acute distress sitting comfortably in exam room  HENT:     Head: Normocephalic and atraumatic.  Cardiovascular:     Rate and Rhythm: Normal rate and regular rhythm.     Heart sounds: Normal heart sounds, S1 normal and S2 normal. No murmur heard.    Comments: Capillary refill within 2 seconds right toes Pulmonary:     Effort: Pulmonary effort is normal.     Breath sounds: Normal breath sounds. No stridor. No wheezing, rhonchi or rales.     Comments: Clear to auscultation bilaterally Abdominal:     General: Bowel sounds  are normal.     Palpations: Abdomen is soft.     Tenderness: There is no abdominal tenderness.  Musculoskeletal:     Right lower leg: No edema.     Right ankle: No swelling. Tenderness present over the lateral malleolus. No medial malleolus tenderness. Decreased range of motion.     Right foot: Normal range of motion. Bunion present. No deformity.     Comments: Right ankle: Tenderness palpation over right lateral ankle without deformity.  Decreased range of motion with inversion and eversion secondary to pain.  Foot is neurovascularly intact.  Feet:     Right foot:     Protective Sensation: 10 sites tested.  10 sites sensed.     Skin integrity: Skin integrity normal.     Toenail Condition: Right toenails are normal.  Neurological:     Mental Status: He is alert.  Psychiatric:        Behavior: Behavior is cooperative.      UC Treatments / Results  Labs (all labs ordered are listed, but only abnormal results are displayed) Labs Reviewed - No data to display  EKG   Radiology No results found.  Procedures Procedures (including critical care time)  Medications Ordered in UC Medications - No data to display  Initial Impression / Assessment and Plan / UC Course  I have reviewed the triage vital signs and the nursing notes.  Pertinent labs & imaging results that were available during my care of the patient were reviewed by me and considered in my medical decision making (see chart for details).     Patient is well-appearing, afebrile, nontoxic, nontachycardic.  Patient had bony tenderness over proximal metatarsals and lateral malleolus so x-ray of foot and ankle was ordered.  Unfortunately, we do not have imaging onsite today so he was sent for outpatient imaging we will contact him if this is abnormal and changes our treatment plan.  He was placed in a brace for comfort and support.  Recommended RICE protocol.  He was given ibuprofen  for pain relief with instruction not to take  additional NSAIDs with this medication due to risk of GI bleeding.  He can use Tylenol  for breakthrough pain.  Discussed that if he is not improving within a few  days he is to follow-up with [facility] was given the contact information for this group with instruction to call to schedule an appointment.  If anything worsens and he has increasing pain, swelling, numbness or paresthesias in his foot he needs to be seen immediately.  Strict return precautions given.  Work excuse note provided.  Final Clinical Impressions(s) / UC Diagnoses   Final diagnoses:  Acute right ankle pain  Sprain of right ankle, unspecified ligament, initial encounter  Right foot pain     Discharge Instructions      Please go get the x-rays of your foot and ankle.  I will contact you if these are abnormal.  Keep your foot elevated and use ice for 15 minutes at a time 3-4 times a day.  Take the ibuprofen  for pain relief.  Do not take additional NSAIDs with this medication including aspirin, ibuprofen /Advil , naproxen/Aleve.  You can use Tylenol /acetaminophen  for breakthrough pain.  If anything worsens you have increasing pain, swelling, numbness or tingling in your foot, difficulty walking you need to be seen immediately.   ED Prescriptions     Medication Sig Dispense Auth. Provider   ibuprofen  (ADVIL ) 600 MG tablet Take 1 tablet (600 mg total) by mouth every 8 (eight) hours as needed. 30 tablet Trevonn Hallum K, PA-C      PDMP not reviewed this encounter.   Budd Cargo, PA-C 09/02/23 1750

## 2023-09-02 NOTE — Discharge Instructions (Signed)
 Please go get the x-rays of your foot and ankle.  I will contact you if these are abnormal.  Keep your foot elevated and use ice for 15 minutes at a time 3-4 times a day.  Take the ibuprofen  for pain relief.  Do not take additional NSAIDs with this medication including aspirin, ibuprofen /Advil , naproxen/Aleve.  You can use Tylenol /acetaminophen  for breakthrough pain.  If anything worsens you have increasing pain, swelling, numbness or tingling in your foot, difficulty walking you need to be seen immediately.

## 2023-09-02 NOTE — ED Triage Notes (Signed)
 Pt states that he twisted his right ankle and has some ankle pain. X2 days
